# Patient Record
Sex: Female | Born: 1995 | Race: White | Hispanic: No | Smoking: Never smoker
Health system: Southern US, Community
[De-identification: ages and names within clinical notes are randomized; demographics above are authoritative.]

## PROBLEM LIST (undated history)

## (undated) DIAGNOSIS — F329 Major depressive disorder, single episode, unspecified: Secondary | ICD-10-CM

## (undated) DIAGNOSIS — J039 Acute tonsillitis, unspecified: Secondary | ICD-10-CM

## (undated) DIAGNOSIS — N921 Excessive and frequent menstruation with irregular cycle: Secondary | ICD-10-CM

## (undated) DIAGNOSIS — G4700 Insomnia, unspecified: Secondary | ICD-10-CM

## (undated) DIAGNOSIS — E210 Primary hyperparathyroidism: Secondary | ICD-10-CM

## (undated) DIAGNOSIS — R002 Palpitations: Secondary | ICD-10-CM

## (undated) DIAGNOSIS — H5462 Unqualified visual loss, left eye, normal vision right eye: Secondary | ICD-10-CM

## (undated) DIAGNOSIS — G43009 Migraine without aura, not intractable, without status migrainosus: Secondary | ICD-10-CM

## (undated) DIAGNOSIS — R300 Dysuria: Secondary | ICD-10-CM

## (undated) DIAGNOSIS — F32A Depression, unspecified: Secondary | ICD-10-CM

## (undated) DIAGNOSIS — R52 Pain, unspecified: Secondary | ICD-10-CM

## (undated) DIAGNOSIS — R631 Polydipsia: Secondary | ICD-10-CM

## (undated) HISTORY — DX: Excessive and frequent menstruation with irregular cycle: N92.1

## (undated) HISTORY — PX: OTHER SURGICAL HISTORY: SHX169

## (undated) HISTORY — DX: Major depressive disorder, single episode, unspecified: F32.9

## (undated) HISTORY — DX: Acute tonsillitis, unspecified: J03.90

## (undated) HISTORY — DX: Migraine without aura, not intractable, without status migrainosus: G43.009

## (undated) HISTORY — DX: Unqualified visual loss, left eye, normal vision right eye: H54.62

## (undated) HISTORY — DX: Polydipsia: R63.1

## (undated) HISTORY — DX: Insomnia, unspecified: G47.00

## (undated) HISTORY — DX: Depression, unspecified: F32.A

## (undated) HISTORY — DX: Palpitations: R00.2

## (undated) HISTORY — DX: Dysuria: R30.0

## (undated) HISTORY — DX: Primary hyperparathyroidism: E21.0

## (undated) HISTORY — DX: Pain, unspecified: R52

---
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
## 2015-02-25 ENCOUNTER — Other Ambulatory Visit: Payer: Self-pay | Admitting: Family Medicine

## 2015-03-22 ENCOUNTER — Ambulatory Visit: Payer: Self-pay | Admitting: Family Medicine

## 2015-03-23 ENCOUNTER — Encounter: Payer: Self-pay | Admitting: Family Medicine

## 2015-03-23 ENCOUNTER — Ambulatory Visit (INDEPENDENT_AMBULATORY_CARE_PROVIDER_SITE_OTHER): Payer: Self-pay | Admitting: Family Medicine

## 2015-03-23 VITALS — BP 120/90 | HR 96 | Temp 98.3°F | Resp 16 | Wt 274.4 lb

## 2015-03-23 DIAGNOSIS — J039 Acute tonsillitis, unspecified: Secondary | ICD-10-CM

## 2015-03-23 DIAGNOSIS — G479 Sleep disorder, unspecified: Secondary | ICD-10-CM | POA: Insufficient documentation

## 2015-03-23 DIAGNOSIS — B3731 Acute candidiasis of vulva and vagina: Secondary | ICD-10-CM | POA: Insufficient documentation

## 2015-03-23 DIAGNOSIS — H5440 Blindness, one eye, unspecified eye: Secondary | ICD-10-CM | POA: Insufficient documentation

## 2015-03-23 DIAGNOSIS — F32A Depression, unspecified: Secondary | ICD-10-CM | POA: Insufficient documentation

## 2015-03-23 DIAGNOSIS — Z87898 Personal history of other specified conditions: Secondary | ICD-10-CM | POA: Insufficient documentation

## 2015-03-23 DIAGNOSIS — R002 Palpitations: Secondary | ICD-10-CM | POA: Insufficient documentation

## 2015-03-23 DIAGNOSIS — E669 Obesity, unspecified: Secondary | ICD-10-CM | POA: Insufficient documentation

## 2015-03-23 DIAGNOSIS — G43009 Migraine without aura, not intractable, without status migrainosus: Secondary | ICD-10-CM

## 2015-03-23 DIAGNOSIS — B373 Candidiasis of vulva and vagina: Secondary | ICD-10-CM | POA: Insufficient documentation

## 2015-03-23 DIAGNOSIS — R631 Polydipsia: Secondary | ICD-10-CM | POA: Insufficient documentation

## 2015-03-23 DIAGNOSIS — J029 Acute pharyngitis, unspecified: Secondary | ICD-10-CM | POA: Insufficient documentation

## 2015-03-23 DIAGNOSIS — J0100 Acute maxillary sinusitis, unspecified: Secondary | ICD-10-CM

## 2015-03-23 DIAGNOSIS — Z8742 Personal history of other diseases of the female genital tract: Secondary | ICD-10-CM | POA: Insufficient documentation

## 2015-03-23 DIAGNOSIS — F329 Major depressive disorder, single episode, unspecified: Secondary | ICD-10-CM | POA: Insufficient documentation

## 2015-03-23 MED ORDER — TOPIRAMATE 50 MG PO TABS: 50.0000 mg | ORAL_TABLET | Freq: Every day | ORAL | Status: DC

## 2015-03-23 MED ORDER — AMOXICILLIN 875 MG PO TABS: 875.0000 mg | ORAL_TABLET | Freq: Two times a day (BID) | ORAL | Status: DC
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
## 2015-04-14 ENCOUNTER — Other Ambulatory Visit: Payer: Self-pay | Admitting: Family Medicine

## 2015-04-23 ENCOUNTER — Telehealth: Payer: Self-pay | Admitting: Family Medicine

## 2015-04-23 DIAGNOSIS — J312 Chronic pharyngitis: Secondary | ICD-10-CM

## 2015-04-23 NOTE — Telephone Encounter (Signed)
See patient message

## 2015-04-23 NOTE — Telephone Encounter (Signed)
Pt mom, Larita Fife called states pt was in 3 weeks ago for sinus congestion and sore throat.  Pt mom states pt has sinus congestion, sore throat and cough again.  Pt mom states the Rx she had caused pt to have an upset stomach.  Pt mom is requesting a referral to [facility].  JY#[phone]/QM

## 2015-04-23 NOTE — Telephone Encounter (Signed)
Referral request

## 2015-04-23 NOTE — Telephone Encounter (Signed)
Persistent sore throat and sinus congestion over the past 3 weeks despite treatment with antibiotic and Mucinex. Mother requesting referral to [facility].

## 2015-04-26 ENCOUNTER — Other Ambulatory Visit: Payer: Self-pay | Admitting: Family Medicine

## 2015-04-26 NOTE — Telephone Encounter (Signed)
Dennis patient 

## 2015-05-07 ENCOUNTER — Other Ambulatory Visit: Payer: Self-pay | Admitting: Family Medicine

## 2015-07-24 ENCOUNTER — Other Ambulatory Visit: Payer: Self-pay | Admitting: Family Medicine

## 2015-07-26 ENCOUNTER — Other Ambulatory Visit: Payer: Self-pay | Admitting: Family Medicine

## 2015-08-22 ENCOUNTER — Other Ambulatory Visit: Payer: Self-pay | Admitting: Family Medicine

## 2015-09-17 ENCOUNTER — Other Ambulatory Visit: Payer: Self-pay | Admitting: Family Medicine

## 2015-10-07 ENCOUNTER — Encounter: Payer: Self-pay | Admitting: Family Medicine

## 2015-10-07 ENCOUNTER — Ambulatory Visit (INDEPENDENT_AMBULATORY_CARE_PROVIDER_SITE_OTHER): Payer: Medicaid Other | Admitting: Family Medicine

## 2015-10-07 VITALS — BP 118/82 | HR 80 | Temp 98.2°F | Resp 16 | Wt 265.4 lb

## 2015-10-07 DIAGNOSIS — R300 Dysuria: Secondary | ICD-10-CM

## 2015-10-07 LAB — POCT URINALYSIS DIPSTICK
BILIRUBIN UA: NEGATIVE
GLUCOSE UA: NEGATIVE
Ketones, UA: NEGATIVE
Nitrite, UA: NEGATIVE
Protein, UA: NEGATIVE
SPEC GRAV UA: 1.015
Urobilinogen, UA: 0.2
pH, UA: 6.5

## 2015-10-07 MED ORDER — SULFAMETHOXAZOLE-TRIMETHOPRIM 800-160 MG PO TABS: 1.0000 | ORAL_TABLET | Freq: Two times a day (BID) | ORAL | Status: DC
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
## 2015-10-07 NOTE — Patient Instructions (Signed)

## 2015-10-18 ENCOUNTER — Other Ambulatory Visit: Payer: Self-pay | Admitting: Family Medicine

## 2015-12-15 ENCOUNTER — Other Ambulatory Visit: Payer: Self-pay | Admitting: Family Medicine

## 2015-12-18 ENCOUNTER — Other Ambulatory Visit: Payer: Self-pay | Admitting: Family Medicine

## 2015-12-19 ENCOUNTER — Other Ambulatory Visit: Payer: Self-pay | Admitting: Family Medicine

## 2015-12-20 ENCOUNTER — Other Ambulatory Visit: Payer: Self-pay

## 2015-12-20 NOTE — Telephone Encounter (Signed)
[facility] Top[facility] 50 mg.

## 2015-12-27 ENCOUNTER — Telehealth: Payer: Self-pay | Admitting: Family Medicine

## 2016-01-17 ENCOUNTER — Encounter: Payer: Self-pay | Admitting: Family Medicine

## 2016-01-17 ENCOUNTER — Ambulatory Visit
Admission: RE | Admit: 2016-01-17 | Discharge: 2016-01-17 | Disposition: A | Discharge status: Home / Self Care | Payer: Medicaid Other | Source: Ambulatory Visit | Attending: Family Medicine | Admitting: Family Medicine

## 2016-01-17 ENCOUNTER — Telehealth: Payer: Self-pay | Admitting: Family Medicine

## 2016-01-17 ENCOUNTER — Ambulatory Visit (INDEPENDENT_AMBULATORY_CARE_PROVIDER_SITE_OTHER): Payer: Medicaid Other | Admitting: Family Medicine

## 2016-01-17 ENCOUNTER — Other Ambulatory Visit: Payer: Self-pay | Admitting: Family Medicine

## 2016-01-17 VITALS — BP 118/90 | HR 92 | Temp 97.7°F | Resp 16 | Wt 260.2 lb

## 2016-01-17 DIAGNOSIS — M25562 Pain in left knee: Secondary | ICD-10-CM | POA: Insufficient documentation

## 2016-01-17 DIAGNOSIS — M25462 Effusion, left knee: Secondary | ICD-10-CM | POA: Diagnosis not present

## 2016-01-17 MED ORDER — HYDROCODONE-ACETAMINOPHEN 10-325 MG PO TABS: 1.0000 | ORAL_TABLET | Freq: Three times a day (TID) | ORAL | Status: DC | PRN

## 2016-01-17 MED ORDER — DICLOFENAC POTASSIUM 50 MG PO TABS: 50.0000 mg | ORAL_TABLET | Freq: Three times a day (TID) | ORAL | Status: DC

## 2016-01-17 NOTE — Telephone Encounter (Signed)
May switch to Hydrocodone/APAP (Norco) 5/325 mg 1 TID prn pain #21. Encourage to use the Ibuprofen 600 mg QID first.

## 2016-01-17 NOTE — Telephone Encounter (Signed)
Diclofenac requires a PA so patient's mother would like medication to be switched. Patient has Medicaid. Please advise.

## 2016-01-17 NOTE — Patient Instructions (Signed)
Patellar Dislocation and Subluxation With Phase I Rehab Injuries to the knee often include knee cap (patellar) dislocation or subluxation. The patella is a V-shaped bone that sits in a groove in the thigh bone (trochlea). A patellar dislocation occurs when the knee cap is displaced from the trochlea, and the joint surfaces are no longer touching. A subluxation is a similar injury, where the knee cap becomes displaced, but the joint surfaces are still touching. Patellar dislocations and subluxations are most common in adolescents and younger adults. SYMPTOMS   Severe pain in the front of the knee when attempting to move the knee.  A feeling of the knee giving way.  Tenderness, swelling, and bruising (contusion) of the knee.  Numbness or paralysis below the dislocation, from pinching, cutting, or pressure on the blood vessels or nerves (uncommon).  Visible deformity, especially if the dislocation of the knee cap occurs towards the outside of the knee.  Lump on the inner knee, which is the end of the inner part of the thigh bone (femur). CAUSES  Patellar dislocations are caused by a force placed on the knee cap, that is strong enough to displace the bone from its proper alignment. Common causes of injury include:  Direct hit (trauma) to the knee.  Twisting or pivoting injury to the lower limb, when the foot is planted on the ground.  Powerful muscle contraction.  Birth defect (congenital abnormality), such as shallow or malformed joint surfaces. RISK INCREASES WITH:  Contact sports (football, rugby, soccer), sports that require jumping and landing (basketball, volleyball), or sports in which cleats are worn on shoes.  People with wide pelvis, knocked knees, shallow or malformed joint surfaces.  Previous knee injury.  Poor strength and flexibility. PREVENTION  Warm up and stretch properly before activity.  Maintain physical fitness:  Strength, flexibility, and  endurance.  Cardiovascular fitness.  For jumping or contact sports, protect the knee cap with supportive devices (elastic bandages, tape, braces, knee sleeves with a hole for the patella and a built-up outer side, or straps to pull the patella inward, or knee pads).  Use cleats of proper length. PROGNOSIS  If treated properly, patellar dislocations and subluxations usually require at least 6 weeks to heal.  RELATED COMPLICATIONS   Associated fracture or joint cartilage injury.  Damage to nearby nerves or major blood vessels (rare).  Longer healing time or recurring dislocation, if activity is resumed too soon.  Excessive bleeding within the knee, due to dislocation.  Knee cap pain and giving way, often due to inadequate or incomplete rehabilitation.  Unstable or arthritic joint, following repeated injury or delayed treatment. TREATMENT Patellar dislocations and subluxations require immediate realigning of the bones (reduction). Realigning is often completed by hand. However, surgery is sometimes needed. After realignment, treatment first involves the use of ice and medicine, to reduce pain and inflammation. Elevating the injured knee above the level of the heart will also help reduce inflammation. Restraining the knee is often needed, to allow for healing, for up to 6 weeks. After restraint, it is important to perform strengthening and stretching exercises to help regain strength and a full range of motion. These exercises may be completed at home or with a therapist. MEDICATION   If pain medicine is needed, nonsteroidal anti-inflammatory medicines (aspirin and ibuprofen), or other minor pain relievers (acetaminophen), are often advised.  Do not take pain medicine for 7 days before surgery.  Prescription pain relievers may be given, if your caregiver thinks they are needed. Use only   as directed and only as much as you need. HEAT AND COLD  Cold treatment (icing) should be applied for  10 to 15 minutes every 2 to 3 hours for inflammation and pain, and immediately after activity that aggravates your symptoms. Use ice packs or an ice massage.  Heat treatment may be used before performing stretching and strengthening activities prescribed by your caregiver, physical therapist, or athletic trainer. Use a heat pack or a warm water soak. SEEK MEDICAL CARE IF:  Pain, tenderness, or swelling gets worse, despite treatment.  You experience pain, numbness, or coldness in the foot.  Blue, gray, or dark color appears in the toenails.  Any of the following signs of infection occur after surgery: fever, increased pain, swelling, redness, drainage of fluids, or bleeding in the affected area.  New, unexplained symptoms develop. (Drugs used in treatment may produce side effects.) EXERCISES PHASE I EXERCISES RANGE OF MOTION (ROM) AND STRETCHING EXERCISES - Patellar Dislocation and Subluxation Phase I  FIRST TIME DISLOCATIONS OR SUBLUXATIONS: If you have dislocated or subluxated your knee cap for the first time, your caregiver may ask you to wear a knee brace for up to 6 weeks after your injury. This brace will keep your knee completely straight so that the healing tissues are not disrupted by your knee movement. Your caregiver may allow some motion at your knee by using a hinged brace or by allowing you to take your brace off for a limited amount of time to gently bend and straighten your knee. If this is the case, closely follow your caregiver's directions, in order to allow the best recovery.   CHRONIC OR REPEATED DISLOCATIONS OR SUBLUXATIONS: If you have chronic or repeated dislocations or subluxations, your caregiver will likely not ask you to use a brace. He or she will likely have you begin gentle range of motion activities, within the range that is comfortable for you.  Once you are allowed to start moving your knee, these are some of the initial exercises that may be included in your  rehabilitation program. Continue to perform them until you see your caregiver again or until your symptoms go away. While completing these exercises, remember:   Restoring tissue flexibility helps normal motion to return to the joints. This allows healthier, less painful movement and activity.  An effective stretch should be held for at least 30 seconds.  A stretch should never be painful. You should only feel a gentle lengthening or release in the stretched tissue. RANGE OF MOTION - Knee Flexion, Active  Lie on your back with both knees straight. (If this causes back discomfort, bend your opposite knee, placing your foot flat on the floor.)  Slowly slide your heel back toward your buttocks until you feel a gentle stretch in the front of your knee or thigh.  Hold for __________ seconds. Slowly slide your heel back to the starting position. Repeat __________ times. Complete this exercise __________ times per day.  RANGE OF MOTION - Knee Flexion and Extension, Active-Assisted  Sit on the edge of a table or chair with your thighs firmly supported. It may be helpful to place a folded towel under the end of your right / left thigh.  Flexion (bending): Place the ankle of your healthy leg on top of the other ankle. Use your healthy leg to gently bend your right / left knee until you feel a mild tension across the top of your knee.  Hold for __________ seconds.  Extension (straightening): Switch your ankles   so your right / left leg is on top. Use your healthy leg to straighten your right / left knee until you feel a mild tension on the backside of your knee.  Hold for __________ seconds. Repeat __________ times. Complete this exercise __________ times per day. STRETCH - Knee Flexion, Supine  Lie on the floor with your right / left heel and foot lightly touching the wall. (Place both feet on the wall if you do not use a door frame.)  Without using any effort, allow gravity to slide your foot  down the wall slowly until you feel a gentle stretch in the front of your right / left knee.  Hold this stretch for __________ seconds. Then return the leg to the starting position, using your healthy leg for help, if needed. Repeat __________ times. Complete this stretch __________ times per day.  STRENGTHENING EXERCISES - Patellar Dislocation and Subluxation Phase I These exercises may help you when beginning to rehabilitate your injury. They may resolve your symptoms with or without further involvement from your physician, physical therapist or athletic trainer. While completing these exercises, remember:   Muscles can gain both the endurance and the strength needed for everyday activities through controlled exercises.  Complete these exercises as instructed by your physician, physical therapist or athletic trainer. Increase the resistance and repetitions only as guided by your caregiver. STRENGTH - Quadriceps, Isometrics  Lie on your back with your right / left leg extended and your opposite knee bent.  Gradually tense the muscles in the front of your right / left thigh. You should see either your knee cap slide up toward your hip or increased dimpling just above the knee. This motion will push the back of the knee down toward the floor, mat, or bed on which you are lying.  Hold the muscle as tight as you can, without increasing your pain, for __________ seconds.  Relax the muscles slowly and completely in between each repetition. Repeat __________ times. Complete this exercise __________ times per day.  STRENGTH - Quadriceps, Straight Leg Raises Quality counts! Watch for signs that the quadriceps muscle is working, to be sure you are strengthening the correct muscles and not "cheating" by substituting with healthier muscles.  Lay on your back with your right / left leg extended and your opposite knee bent.  Tense the muscles in the front of your right / left thigh. You should see either  your knee cap slide up or increased dimpling just above the knee. Your thigh may even shake a bit.  Tighten these muscles even more and raise your leg 4 to 6 inches off the floor. Hold for __________ seconds.  Keeping these muscles tense, lower your leg.  Relax the muscles slowly and completely between each repetition. Repeat __________ times. Complete this exercise __________ times per day.  STRENGTH - Hip Abductors, Straight Leg Raises Be aware of your form throughout the entire exercise, so that you exercise the correct muscles. Poor form means that you are not strengthening the correct muscles.  Lie on your side so that your head, shoulders, knee and hip line up. You may bend your lower knee to help maintain your balance. Your right / left leg should be on top.  Roll your hips slightly forward, so that your hips are stacked directly over each other and your right / left knee is facing forward.  Lift your top leg up 4-6 inches, leading with your heel. Be sure that your foot does not drift forward   or that your knee does not roll toward the ceiling.  Hold this position for __________ seconds. You should feel the muscles in your outer hip lifting. (You may not notice this until your leg begins to tire.)  Slowly lower your leg to the starting position. Allow the muscles to fully relax before beginning the next repetition. Repeat __________ times. Complete this exercise __________ times per day.  STRENGTH - Hip Extensors, Straight Leg Raises  Lie on your stomach on a firm surface.  Tense the muscles in your buttocks to lift your right / left leg about 4 inches. If you cannot lift your leg this high without arching your back, place a pillow under your hips.  Keep your knee straight. Hold __________ seconds.  Slowly lower your leg to the starting position and allow it to relax completely before completing the next repetition. Repeat __________ times. Complete this exercise __________ times  per day.  STRENGTH - Hip Adductors, Straight Leg Raises  Lie on your side so that your head, shoulders, knee and hip line up. You may place your upper foot in front, to help maintain your balance. Your right / left leg should be on the bottom.  Roll your hips slightly forward, so that your hips are stacked directly over each other and your right / left knee is facing forward.  Tense the muscles in your inner thigh and lift your bottom leg 4-6 inches. Hold this position for __________ seconds.  Slowly lower your leg to the starting position. Allow the muscles to fully relax before beginning the next repetition. Repeat __________ times. Complete this exercise __________ times per day.    This information is not intended to replace advice given to you by your health care provider. Make sure you discuss any questions you have with your health care provider.   Document Released: 08/21/2005 Document Revised: 05/12/2015 Document Reviewed: 12/03/2008 Elsevier Interactive Patient Education 2016 [employer]  
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
## 2016-01-17 NOTE — Telephone Encounter (Signed)
[facility] called saying they needed a PR on her medication diclofenac (CATAFLAM) 50 MG tablet  Mom asked if you would just prescribe the hydrocodone instead  Mom's call back is [phone]  Please let mom know something today.  Thanks, Barth Kirkseri

## 2016-01-17 NOTE — Telephone Encounter (Signed)
Patient's mother Larita FifeLynn advised as directed below.

## 2016-01-19 ENCOUNTER — Other Ambulatory Visit: Payer: Self-pay | Admitting: Family Medicine

## 2016-01-27 ENCOUNTER — Ambulatory Visit (INDEPENDENT_AMBULATORY_CARE_PROVIDER_SITE_OTHER): Payer: Medicaid Other | Admitting: Family Medicine

## 2016-01-27 ENCOUNTER — Encounter: Payer: Self-pay | Admitting: Family Medicine

## 2016-01-27 VITALS — BP 122/78 | HR 84 | Temp 98.4°F | Resp 16 | Wt 260.0 lb

## 2016-01-27 DIAGNOSIS — M25562 Pain in left knee: Secondary | ICD-10-CM | POA: Diagnosis not present

## 2016-01-27 DIAGNOSIS — Z8739 Personal history of other diseases of the musculoskeletal system and connective tissue: Secondary | ICD-10-CM | POA: Diagnosis not present

## 2016-01-27 DIAGNOSIS — S83002D Unspecified subluxation of left patella, subsequent encounter: Secondary | ICD-10-CM | POA: Diagnosis not present

## 2016-01-27 DIAGNOSIS — Z87828 Personal history of other (healed) physical injury and trauma: Secondary | ICD-10-CM
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
## 2016-05-10 ENCOUNTER — Other Ambulatory Visit: Payer: Self-pay

## 2016-05-10 NOTE — Telephone Encounter (Signed)
Request received from [facility] requesting Amitriptyline and Levora. Patient switched pharmacies and RX expired per [facility].

## 2016-05-12 MED ORDER — LEVONORGESTREL-ETHINYL ESTRAD 0.15-30 MG-MCG PO TABS: 1.0000 | ORAL_TABLET | Freq: Every day | ORAL | 11 refills | Status: DC

## 2016-05-12 MED ORDER — AMITRIPTYLINE HCL 50 MG PO TABS: 50.0000 mg | ORAL_TABLET | Freq: Every day | ORAL | 6 refills | Status: DC

## 2016-06-16 ENCOUNTER — Other Ambulatory Visit: Payer: Self-pay | Admitting: Family Medicine

## 2016-07-19 ENCOUNTER — Other Ambulatory Visit: Payer: Self-pay | Admitting: Family Medicine

## 2016-08-24 ENCOUNTER — Other Ambulatory Visit: Payer: Self-pay | Admitting: Family Medicine

## 2016-09-22 ENCOUNTER — Other Ambulatory Visit: Payer: Self-pay

## 2016-09-22 NOTE — Telephone Encounter (Signed)
Refill request received from [facility] requesting amitriptyline (ELAVIL) 50 MG tablet

## 2016-09-22 NOTE — Telephone Encounter (Signed)
[facility] advised via fax that patient needs OV before further refills.

## 2016-09-22 NOTE — Telephone Encounter (Signed)
Refill on 08-24-16 should have a refill remaining. Remind patient to schedule follow up appointment before further refills.

## 2016-09-25 NOTE — Telephone Encounter (Signed)
Patient picked up RX on 09/19/2016 left patient a voicemail advising her to schedule a FU OV before next RF is due.

## 2016-10-25 ENCOUNTER — Other Ambulatory Visit: Payer: Self-pay | Admitting: Family Medicine

## 2016-11-07 ENCOUNTER — Ambulatory Visit (INDEPENDENT_AMBULATORY_CARE_PROVIDER_SITE_OTHER): Payer: Medicaid Other | Admitting: Family Medicine

## 2016-11-07 ENCOUNTER — Encounter: Payer: Self-pay | Admitting: Family Medicine

## 2016-11-07 VITALS — BP 104/68 | HR 79 | Temp 98.4°F | Resp 16 | Wt 231.6 lb

## 2016-11-07 DIAGNOSIS — F329 Major depressive disorder, single episode, unspecified: Secondary | ICD-10-CM | POA: Diagnosis not present

## 2016-11-07 DIAGNOSIS — G43809 Other migraine, not intractable, without status migrainosus: Secondary | ICD-10-CM

## 2016-11-07 DIAGNOSIS — F32A Depression, unspecified: Secondary | ICD-10-CM

## 2016-11-07 MED ORDER — AMITRIPTYLINE HCL 50 MG PO TABS: 50.0000 mg | ORAL_TABLET | Freq: Every day | ORAL | 6 refills | Status: DC
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
## 2016-11-08 LAB — COMPREHENSIVE METABOLIC PANEL
ALBUMIN: 4.3 g/dL (ref 3.5–5.5)
ALT: 12 IU/L (ref 0–32)
AST: 13 IU/L (ref 0–40)
Albumin/Globulin Ratio: 1.8 (ref 1.2–2.2)
Alkaline Phosphatase: 68 IU/L (ref 39–117)
BUN / CREAT RATIO: 16 (ref 9–23)
BUN: 14 mg/dL (ref 6–20)
Bilirubin Total: 0.8 mg/dL (ref 0.0–1.2)
CALCIUM: 9.5 mg/dL (ref 8.7–10.2)
CO2: 20 mmol/L (ref 18–29)
CREATININE: 0.86 mg/dL (ref 0.57–1.00)
Chloride: 105 mmol/L (ref 96–106)
GFR, EST AFRICAN AMERICAN: 112 mL/min/{1.73_m2} (ref >59)
GFR, EST NON AFRICAN AMERICAN: 98 mL/min/{1.73_m2} (ref >59)
GLOBULIN, TOTAL: 2.4 g/dL (ref 1.5–4.5)
Glucose: 86 mg/dL (ref 65–99)
Potassium: 4.2 mmol/L (ref 3.5–5.2)
SODIUM: 142 mmol/L (ref 134–144)
Total Protein: 6.7 g/dL (ref 6.0–8.5)

## 2016-11-08 LAB — CBC WITH DIFFERENTIAL/PLATELET
BASOS: 0 %
Basophils Absolute: 0 10*3/uL (ref 0.0–0.2)
EOS (ABSOLUTE): 0.2 10*3/uL (ref 0.0–0.4)
Eos: 2 %
HEMATOCRIT: 41.6 % (ref 34.0–46.6)
HEMOGLOBIN: 14 g/dL (ref 11.1–15.9)
IMMATURE GRANULOCYTES: 0 %
Immature Grans (Abs): 0 10*3/uL (ref 0.0–0.1)
LYMPHS ABS: 2.3 10*3/uL (ref 0.7–3.1)
Lymphs: 28 %
MCH: 33 pg (ref 26.6–33.0)
MCHC: 33.7 g/dL (ref 31.5–35.7)
MCV: 98 fL — ABNORMAL HIGH (ref 79–97)
MONOCYTES: 8 %
Monocytes Absolute: 0.6 10*3/uL (ref 0.1–0.9)
NEUTROS PCT: 62 %
Neutrophils Absolute: 4.9 10*3/uL (ref 1.4–7.0)
Platelets: 334 10*3/uL (ref 150–379)
RBC: 4.24 x10E6/uL (ref 3.77–5.28)
RDW: 12.1 % — ABNORMAL LOW (ref 12.3–15.4)
WBC: 8 10*3/uL (ref 3.4–10.8)

## 2016-11-08 LAB — TSH: TSH: 2.27 u[IU]/mL (ref 0.450–4.500)

## 2016-11-10 ENCOUNTER — Telehealth: Payer: Self-pay | Admitting: Emergency Medicine

## 2016-11-10 NOTE — Telephone Encounter (Signed)
Pt advised-aa 

## 2016-11-10 NOTE — Telephone Encounter (Signed)
-----   Message from Tamsen Roersennis E Chrismon, [place] sent at 11/09/2016  5:16 PM EST ----- Normal blood tests except slight increase of  MCV. This increase in the size of RBC's may mean there is a need for extra B vitamins such as Stresstabs or B-Complex daily.

## 2016-11-14 ENCOUNTER — Telehealth: Payer: Self-pay | Admitting: Family Medicine

## 2016-11-14 ENCOUNTER — Other Ambulatory Visit: Payer: Self-pay | Admitting: Family Medicine

## 2016-11-14 DIAGNOSIS — Z975 Presence of (intrauterine) contraceptive device: Secondary | ICD-10-CM

## 2016-11-14 NOTE — Telephone Encounter (Signed)
Please review. Bronco Mcgrory Drozdowski, CMA  

## 2016-11-14 NOTE — Telephone Encounter (Signed)
Pt's mom called on pt's behalf to request a referral to GYN b/c she would like to discuss getting an IUD. Pt had OV with Maurine Ministerennis on 11/07/16 and understands if she needs to come back in for OV to process the referral but wanted to see if the referral could be done without another OV. [facility]. CB# [phone]. Please advise. Thanks TNP

## 2016-11-14 NOTE — Telephone Encounter (Signed)
Placed order in chart for referral.

## 2016-11-14 NOTE — Progress Notes (Signed)
Requests referral to [facility] for IUD placement if appropriate.

## 2016-11-15 NOTE — Telephone Encounter (Signed)
Advised. Elysha Daw Drozdowski, CMA  

## 2016-12-26 ENCOUNTER — Telehealth: Payer: Self-pay | Admitting: Obstetrics & Gynecology

## 2016-12-26 NOTE — Telephone Encounter (Signed)
Called pt and left voicemail. At this time we aren't accepting new Medicaid Pt . Pt has never been seen before. Called [facility]

## 2017-01-09 ENCOUNTER — Ambulatory Visit (INDEPENDENT_AMBULATORY_CARE_PROVIDER_SITE_OTHER): Payer: Medicaid Other | Admitting: Certified Nurse Midwife

## 2017-01-09 ENCOUNTER — Encounter: Payer: Self-pay | Admitting: Certified Nurse Midwife

## 2017-01-09 VITALS — BP 110/73 | HR 77 | Ht 66.0 in | Wt 221.0 lb

## 2017-01-09 DIAGNOSIS — Z3009 Encounter for other general counseling and advice on contraception: Secondary | ICD-10-CM

## 2017-01-09 NOTE — Patient Instructions (Signed)

## 2017-01-09 NOTE — Progress Notes (Signed)
Subjective:    Emily Simon is a 21 y.o. female who presents for contraception counseling. The patient has no complaints today. The patient is currently sexually active. Pertinent past medical history: migraines.  Menstrual History: OB History    Gravida Para Term Preterm AB Living   0 0 0 0 0 0   SAB TAB Ectopic Multiple Live Births   0 0 0 0 0       Patient's last menstrual period was 01/09/2017 (exact date).    The following portions of the patient's history were reviewed and updated as appropriate: allergies, current medications, past family history, past medical history, past social history, past surgical history and problem list.  Review of Systems Pertinent items noted in HPI and remainder of comprehensive ROS otherwise negative.   Objective:    No exam performed today..   Assessment:    21 y.o., starting IUD, no contraindications.   Plan:  Reviewed multiple contraceptive options. She is requesting IUD for management of heavy menstrual bleeding. Discussed benefits and risks of IUD placement, Reviewed current medications used for Migraines may decrease effectiveness of birth control action. She is aware and agrees to condom use.  Reviewed common side effects. Mirena pamphlet given to pt to review. Procedure explained. Encouraged use of 800 mg Ibuprofen prior to placement. She will make an appointment for placement.   I attest that more than 50% of the visit was spent counseling the pt. I  reviewed birth control options, counseling on risks , benefits , and procedure of mirena placement. I All questions answered. SHe will schedule for placement     Doreene BurkeAnnie Ronesha Heenan, CNM

## 2017-01-16 ENCOUNTER — Ambulatory Visit: Payer: Medicaid Other | Admitting: Certified Nurse Midwife

## 2017-01-17 ENCOUNTER — Ambulatory Visit (INDEPENDENT_AMBULATORY_CARE_PROVIDER_SITE_OTHER): Payer: Medicaid Other | Admitting: Certified Nurse Midwife

## 2017-01-17 ENCOUNTER — Encounter: Payer: Self-pay | Admitting: Certified Nurse Midwife

## 2017-01-17 VITALS — BP 110/77 | HR 79 | Wt 224.1 lb

## 2017-01-17 DIAGNOSIS — Z30014 Encounter for initial prescription of intrauterine contraceptive device: Secondary | ICD-10-CM

## 2017-01-17 LAB — POCT URINE PREGNANCY: PREG TEST UR: NEGATIVE

## 2017-01-17 NOTE — Progress Notes (Signed)
    GYNECOLOGY OFFICE PROCEDURE NOTE  Ulyses JarredKayleigh Frieden is a 21 y.o. G0P0000 here for Mirena IUD insertion. No GYN concerns.   IUD Insertion Procedure Note Patient identified, informed consent performed, consent signed.   Discussed risks of irregular bleeding, cramping, infection, malpositioning or misplacement of the IUD outside the uterus which may require further procedure such as laparoscopy. Time out was performed.  Urine pregnancy test negative.  Speculum placed in the vagina.  Cervix visualized.  Cleaned with Betadine x 2.  Grasped anteriorly with a single tooth tenaculum.  Uterus sounded to 8 cm.  Mirena IUD placed per manufacturer's recommendations.  Strings trimmed to 3 cm. Tenaculum was removed, good hemostasis noted.  Patient tolerated procedure well.   Patient was given post-procedure instructions.  She was advised to have backup contraception for one- two week.  Patient was also asked to check IUD strings periodically and follow up in 4 weeks for IUD check.   Doreene BurkeAnnie Juliene Kirsh, CNM

## 2017-01-17 NOTE — Patient Instructions (Signed)

## 2017-02-14 ENCOUNTER — Encounter: Payer: Self-pay | Admitting: Certified Nurse Midwife

## 2017-02-14 ENCOUNTER — Ambulatory Visit (INDEPENDENT_AMBULATORY_CARE_PROVIDER_SITE_OTHER): Payer: Medicaid Other | Admitting: Certified Nurse Midwife

## 2017-02-14 VITALS — BP 106/71 | HR 77 | Wt 219.7 lb

## 2017-02-14 DIAGNOSIS — Z30431 Encounter for routine checking of intrauterine contraceptive device: Secondary | ICD-10-CM

## 2017-02-14 NOTE — Progress Notes (Signed)
GYNECOLOGY OFFICE PROGRESS NOTE  History:  21 y.o. G0P0000 here today for today for IUD string check; Mirena IUD was placed  01/17/17. Complains of occasional mild cramping and spotting, no concerning side effects.  The following portions of the patient's history were reviewed and updated as appropriate: allergies, current medications, past family history, past medical history, past social history, past surgical history and problem list. Has not had a pap smear.   Review of Systems:  Pertinent items are noted in HPI.   Objective:  Physical Exam Blood pressure 106/71, pulse 77, weight 219 lb 11.2 oz (99.7 kg), last menstrual period 02/13/2017. CONSTITUTIONAL: Well-developed, well-nourished female in no acute distress.  HENT:  Normocephalic, atraumatic. External right and left ear normal. Oropharynx is clear and moist EYES: Conjunctivae and EOM are normal.  No scleral icterus.  NECK: Normal range of motion CARDIOVASCULAR: Normal heart rate noted RESPIRATORY: normal effort, no problems with respiration noted ABDOMEN: Soft, no distention noted.   PELVIC: Normal appearing external genitalia; normal appearing vaginal mucosa and cervix.  IUD strings visualized, about 3 cm in length outside cervix.   Assessment & Plan:  Normal IUD check. Patient to keep IUD in place for five years; can come in for removal if she desires pregnancy within the next five years. Routine preventative health maintenance measures emphasized. Annual exam asap for pap smear.    Doreene BurkeAnnie Ayanna Gheen, CNM

## 2017-02-14 NOTE — Patient Instructions (Signed)

## 2017-02-19 ENCOUNTER — Encounter: Payer: Self-pay | Admitting: Certified Nurse Midwife

## 2017-02-19 ENCOUNTER — Ambulatory Visit (INDEPENDENT_AMBULATORY_CARE_PROVIDER_SITE_OTHER): Payer: Medicaid Other | Admitting: Certified Nurse Midwife

## 2017-02-19 VITALS — BP 103/79 | HR 82 | Ht 66.0 in | Wt 219.2 lb

## 2017-02-19 DIAGNOSIS — Z01419 Encounter for gynecological examination (general) (routine) without abnormal findings: Secondary | ICD-10-CM

## 2017-02-19 DIAGNOSIS — Z0000 Encounter for general adult medical examination without abnormal findings: Secondary | ICD-10-CM
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
## 2017-02-19 NOTE — Patient Instructions (Addendum)
Preventive Care 18-39 Years, Female Preventive care refers to lifestyle choices and visits with your health care provider that can promote health and wellness. What does preventive care include?  A yearly physical exam. This is also called an annual well check.  Dental exams once or twice a year.  Routine eye exams. Ask your health care provider how often you should have your eyes checked.  Personal lifestyle choices, including: ? Daily care of your teeth and gums. ? Regular physical activity. ? Eating a healthy diet. ? Avoiding tobacco and drug use. ? Limiting alcohol use. ? Practicing safe sex. ? Taking vitamin and mineral supplements as recommended by your health care provider. What happens during an annual well check? The services and screenings done by your health care provider during your annual well check will depend on your age, overall health, lifestyle risk factors, and family history of disease. Counseling Your health care provider may ask you questions about your:  Alcohol use.  Tobacco use.  Drug use.  Emotional well-being.  Home and relationship well-being.  Sexual activity.  Eating habits.  Work and work Statistician.  Method of birth control.  Menstrual cycle.  Pregnancy history.  Screening You may have the following tests or measurements:  Height, weight, and BMI.  Diabetes screening. This is done by checking your blood sugar (glucose) after you have not eaten for a while (fasting).  Blood pressure.  Lipid and cholesterol levels. These may be checked every 5 years starting at age 66.  Skin check.  Hepatitis C blood test.  Hepatitis B blood test.  Sexually transmitted disease (STD) testing.  BRCA-related cancer screening. This may be done if you have a family history of breast, ovarian, tubal, or peritoneal cancers.  Pelvic exam and Pap test. This may be done every 3 years starting at age 40. Starting at age 59, this may be done every 5  years if you have a Pap test in combination with an HPV test.  Discuss your test results, treatment options, and if necessary, the need for more tests with your health care provider. Vaccines Your health care provider may recommend certain vaccines, such as:  Influenza vaccine. This is recommended every year.  Tetanus, diphtheria, and acellular pertussis (Tdap, Td) vaccine. You may need a Td booster every 10 years.  Varicella vaccine. You may need this if you have not been vaccinated.  HPV vaccine. If you are 69 or younger, you may need three doses over 6 months.  Measles, mumps, and rubella (MMR) vaccine. You may need at least one dose of MMR. You may also need a second dose.  Pneumococcal 13-valent conjugate (PCV13) vaccine. You may need this if you have certain conditions and were not previously vaccinated.  Pneumococcal polysaccharide (PPSV23) vaccine. You may need one or two doses if you smoke cigarettes or if you have certain conditions.  Meningococcal vaccine. One dose is recommended if you are age 27-21 years and a first-year college student living in a residence hall, or if you have one of several medical conditions. You may also need additional booster doses.  Hepatitis A vaccine. You may need this if you have certain conditions or if you travel or work in places where you may be exposed to hepatitis A.  Hepatitis B vaccine. You may need this if you have certain conditions or if you travel or work in places where you may be exposed to hepatitis B.  Haemophilus influenzae type b (Hib) vaccine. You may need this if  you have certain risk factors.  Talk to your health care provider about which screenings and vaccines you need and how often you need them. This information is not intended to replace advice given to you by your health care provider. Make sure you discuss any questions you have with your health care provider. Document Released: 10/17/2001 Document Revised: 05/10/2016  Document Reviewed: 06/22/2015 Elsevier Interactive Patient Education  2017 [employer].  Exercising to [employer] Exercising can help you to lose weight. In order to lose weight through exercise, you need to do vigorous-intensity exercise. You can tell that you are exercising with vigorous intensity if you are breathing very hard and fast and cannot hold a conversation while exercising. Moderate-intensity exercise helps to maintain your current weight. You can tell that you are exercising at a moderate level if you have a higher heart rate and faster breathing, but you are still able to hold a conversation. How often should I exercise? Choose an activity that you enjoy and set realistic goals. Your health care provider can help you to make an activity plan that works for you. Exercise regularly as directed by your health care provider. This may include:  Doing resistance training twice each week, such as: ? Push-ups. ? Sit-ups. ? Lifting weights. ? Using resistance bands.  Doing a given intensity of exercise for a given amount of time. Choose from these options: ? 150 minutes of moderate-intensity exercise every week. ? 75 minutes of vigorous-intensity exercise every week. ? A mix of moderate-intensity and vigorous-intensity exercise every week.  Children, pregnant women, people who are out of shape, people who are overweight, and older adults may need to consult a health care provider for individual recommendations. If you have any sort of medical condition, be sure to consult your health care provider before starting a new exercise program. What are some activities that can help me to lose weight?  Walking at a rate of at least 4.5 miles an hour.  Jogging or running at a rate of 5 miles per hour.  Biking at a rate of at least 10 miles per hour.  Lap swimming.  Roller-skating or in-line skating.  Cross-country skiing.  Vigorous competitive sports, such as football, basketball,  and soccer.  Jumping rope.  Aerobic dancing. How can I be more active in my day-to-day activities?  Use the stairs instead of the elevator.  Take a walk during your lunch break.  If you drive, park your car farther away from work or school.  If you take public transportation, get off one stop early and walk the rest of the way.  Make all of your phone calls while standing up and walking around.  Get up, stretch, and walk around every 30 minutes throughout the day. What guidelines should I follow while exercising?  Do not exercise so much that you hurt yourself, feel dizzy, or get very short of breath.  Consult your health care provider prior to starting a new exercise program.  Wear comfortable clothes and shoes with good support.  Drink plenty of water while you exercise to prevent dehydration or heat stroke. Body water is lost during exercise and must be replaced.  Work out until you breathe faster and your heart beats faster. This information is not intended to replace advice given to you by your health care provider. Make sure you discuss any questions you have with your health care provider. Document Released: 09/23/2010 Document Revised: 01/27/2016 Document Reviewed: 01/22/2014 Elsevier Interactive Patient Education  2018 [place] [facility] [employer] Calories are units of energy. Your body needs a certain amount of calories from food to keep you going throughout the day. When you eat more calories than your body needs, your body stores the extra calories as fat. When you eat fewer calories than your body needs, your body burns fat to get the energy it needs. Calorie counting means keeping track of how many calories you eat and drink each day. Calorie counting can be helpful if you need to lose weight. If you make sure to eat fewer calories than your body needs, you should lose weight. Ask your health care provider what a healthy weight is for you. For  calorie counting to work, you will need to eat the right number of calories in a day in order to lose a healthy amount of weight per week. A dietitian can help you determine how many calories you need in a day and will give you suggestions on how to reach your calorie goal.  A healthy amount of weight to lose per week is usually 1-2 lb (0.5-0.9 kg). This usually means that your daily calorie intake should be reduced by 500-750 calories.  Eating 1,200 - 1,500 calories per day can help most women lose weight.  Eating 1,500 - 1,800 calories per day can help most men lose weight.  What is my plan? My goal is to have __________ calories per day. If I have this many calories per day, I should lose around __________ pounds per week. What do I need to know about calorie counting? In order to meet your daily calorie goal, you will need to:  Find out how many calories are in each food you would like to eat. Try to do this before you eat.  Decide how much of the food you plan to eat.  Write down what you ate and how many calories it had. Doing this is called keeping a food log.  To successfully lose weight, it is important to balance calorie counting with a healthy lifestyle that includes regular activity. Aim for 150 minutes of moderate exercise (such as walking) or 75 minutes of vigorous exercise (such as running) each week. Where do I find calorie information?  The number of calories in a food can be found on a Nutrition Facts label. If a food does not have a Nutrition Facts label, try to look up the calories online or ask your dietitian for help. Remember that calories are listed per serving. If you choose to have more than one serving of a food, you will have to multiply the calories per serving by the amount of servings you plan to eat. For example, the label on a package of bread might say that a serving size is 1 slice and that there are 90 calories in a serving. If you eat 1 slice, you will  have eaten 90 calories. If you eat 2 slices, you will have eaten 180 calories. How do I keep a food log? Immediately after each meal, record the following information in your food log:  What you ate. Don't forget to include toppings, sauces, and other extras on the food.  How much you ate. This can be measured in cups, ounces, or number of items.  How many calories each food and drink had.  The total number of calories in the meal.  Keep your food log near you, such as in a small notebook in your pocket, or use a  mobile app or website. Some programs will calculate calories for you and show you how many calories you have left for the day to meet your goal. What are some calorie counting tips?  Use your calories on foods and drinks that will fill you up and not leave you hungry: ? Some examples of foods that fill you up are nuts and nut butters, vegetables, lean proteins, and high-fiber foods like whole grains. High-fiber foods are foods with more than 5 g fiber per serving. ? Drinks such as sodas, specialty coffee drinks, alcohol, and juices have a lot of calories, yet do not fill you up.  Eat nutritious foods and avoid empty calories. Empty calories are calories you get from foods or beverages that do not have many vitamins or protein, such as candy, sweets, and soda. It is better to have a nutritious high-calorie food (such as an avocado) than a food with few nutrients (such as a bag of chips).  Know how many calories are in the foods you eat most often. This will help you calculate calorie counts faster.  Pay attention to calories in drinks. Low-calorie drinks include water and unsweetened drinks.  Pay attention to nutrition labels for "low fat" or "fat free" foods. These foods sometimes have the same amount of calories or more calories than the full fat versions. They also often have added sugar, starch, or salt, to make up for flavor that was removed with the fat.  Find a way of  tracking calories that works for you. Get creative. Try different apps or programs if writing down calories does not work for you. What are some portion control tips?  Know how many calories are in a serving. This will help you know how many servings of a certain food you can have.  Use a measuring cup to measure serving sizes. You could also try weighing out portions on a kitchen scale. With time, you will be able to estimate serving sizes for some foods.  Take some time to put servings of different foods on your favorite plates, bowls, and cups so you know what a serving looks like.  Try not to eat straight from a bag or box. Doing this can lead to overeating. Put the amount you would like to eat in a cup or on a plate to make sure you are eating the right portion.  Use smaller plates, glasses, and bowls to prevent overeating.  Try not to multitask (for example, watch TV or use your computer) while eating. If it is time to eat, sit down at a table and enjoy your food. This will help you to know when you are full. It will also help you to be aware of what you are eating and how much you are eating. What are tips for following this plan? Reading food labels  Check the calorie count compared to the serving size. The serving size may be smaller than what you are used to eating.  Check the source of the calories. Make sure the food you are eating is high in vitamins and protein and low in saturated and trans fats. Shopping  Read nutrition labels while you shop. This will help you make healthy decisions before you decide to purchase your food.  Make a grocery list and stick to it. Cooking  Try to cook your favorite foods in a healthier way. For example, try baking instead of frying.  Use low-fat dairy products. Meal planning  Use more fruits and vegetables. Half  of your plate should be fruits and vegetables.  Include lean proteins like poultry and fish. How do I count calories when  eating out?  Ask for smaller portion sizes.  Consider sharing an entree and sides instead of getting your own entree.  If you get your own entree, eat only half. Ask for a box at the beginning of your meal and put the rest of your entree in it so you are not tempted to eat it.  If calories are listed on the menu, choose the lower calorie options.  Choose dishes that include vegetables, fruits, whole grains, low-fat dairy products, and lean protein.  Choose items that are boiled, broiled, grilled, or steamed. Stay away from items that are buttered, battered, fried, or served with cream sauce. Items labeled "crispy" are usually fried, unless stated otherwise.  Choose water, low-fat milk, unsweetened iced tea, or other drinks without added sugar. If you want an alcoholic beverage, choose a lower calorie option such as a glass of wine or light beer.  Ask for dressings, sauces, and syrups on the side. These are usually high in calories, so you should limit the amount you eat.  If you want a salad, choose a garden salad and ask for grilled meats. Avoid extra toppings like bacon, cheese, or fried items. Ask for the dressing on the side, or ask for olive oil and vinegar or lemon to use as dressing.  Estimate how many servings of a food you are given. For example, a serving of cooked rice is  cup or about the size of half a baseball. Knowing serving sizes will help you be aware of how much food you are eating at restaurants. The list below tells you how big or small some common portion sizes are based on everyday objects: ? 1 oz-4 stacked dice. ? 3 oz-1 deck of cards. ? 1 tsp-1 die. ? 1 Tbsp- a ping-pong ball. ? 2 Tbsp-1 ping-pong ball. ?  cup- baseball. ? 1 cup-1 baseball. Summary  Calorie counting means keeping track of how many calories you eat and drink each day. If you eat fewer calories than your body needs, you should lose weight.  A healthy amount of weight to lose per week is  usually 1-2 lb (0.5-0.9 kg). This usually means reducing your daily calorie intake by 500-750 calories.  The number of calories in a food can be found on a Nutrition Facts label. If a food does not have a Nutrition Facts label, try to look up the calories online or ask your dietitian for help.  Use your calories on foods and drinks that will fill you up, and not on foods and drinks that will leave you hungry.  Use smaller plates, glasses, and bowls to prevent overeating. This information is not intended to replace advice given to you by your health care provider. Make sure you discuss any questions you have with your health care provider. Document Released: 08/21/2005 Document Revised: 07/21/2016 Document Reviewed: 07/21/2016 Elsevier Interactive Patient Education  2017 [employer].

## 2017-02-20 ENCOUNTER — Encounter: Payer: Self-pay | Admitting: Certified Nurse Midwife

## 2017-02-20 LAB — LIPID PANEL
CHOLESTEROL TOTAL: 145 mg/dL (ref 100–199)
Chol/HDL Ratio: 2.6 ratio (ref 0.0–4.4)
HDL: 55 mg/dL (ref >39)
LDL CALC: 78 mg/dL (ref 0–99)
TRIGLYCERIDES: 59 mg/dL (ref 0–149)
VLDL CHOLESTEROL CAL: 12 mg/dL (ref 5–40)

## 2017-02-20 LAB — TSH: TSH: 1.36 u[IU]/mL (ref 0.450–4.500)

## 2017-02-21 LAB — PAP IG, CT-NG, RFX HPV ASCU
Chlamydia, Nuc. Acid Amp: NEGATIVE
GONOCOCCUS BY NUCLEIC ACID AMP: NEGATIVE
PAP SMEAR COMMENT: 0

## 2017-02-22 ENCOUNTER — Encounter: Payer: Self-pay | Admitting: Certified Nurse Midwife

## 2017-02-23 ENCOUNTER — Other Ambulatory Visit: Payer: Self-pay | Admitting: Family Medicine

## 2017-02-23 MED ORDER — TOPIRAMATE 50 MG PO TABS: 50.0000 mg | ORAL_TABLET | Freq: Every day | ORAL | 3 refills | Status: DC

## 2017-02-23 NOTE — Telephone Encounter (Signed)
Pt contacted office for refill request on the following medications:  topiramate (TOPAMAX) 50 MG tablet.  [facility]  CB#[id number]

## 2017-05-10 ENCOUNTER — Ambulatory Visit: Payer: Medicaid Other | Admitting: Family Medicine

## 2017-05-15 ENCOUNTER — Ambulatory Visit: Payer: Self-pay | Admitting: Family Medicine

## 2017-05-15 NOTE — Progress Notes (Deleted)
   Patient: Emily JarredKayleigh Simon Female    DOB: [birth year]   21 y.o.   MRN: [id number] Visit Date: 05/15/2017  Today's Provider: Dortha Kernennis Chrismon, PA   No chief complaint on file.  Subjective:    HPI Patient is here today to follow up on depression and migraines. Last Ov was on 11/07/2016. She has been taking Amitriptyline and Topamax. Symptoms have been   Previous Medications   AMITRIPTYLINE (ELAVIL) 50 MG TABLET    Take 1 tablet (50 mg total) by mouth at bedtime.   IBUPROFEN (ADVIL,MOTRIN) 600 MG TABLET    Take by mouth.   LEVONORGESTREL (MIRENA, 52 MG,) 20 MCG/24HR IUD    1 each by Intrauterine route once.   TOPIRAMATE (TOPAMAX) 50 MG TABLET    Take 1 tablet (50 mg total) by mouth daily.    Review of Systems  Constitutional: Negative.   Respiratory: Negative.   Cardiovascular: Negative.   Neurological: Positive for headaches.  Psychiatric/Behavioral: Positive for dysphoric mood.    Social History  Substance Use Topics  . Smoking status: Never Smoker  . Smokeless tobacco: Never Used  . Alcohol use No   Objective:   There were no vitals taken for this visit.  Physical Exam      Assessment & Plan:       Follow up: No Follow-up on file.

## 2017-06-13 ENCOUNTER — Other Ambulatory Visit: Payer: Self-pay | Admitting: Family Medicine

## 2017-07-30 ENCOUNTER — Other Ambulatory Visit: Payer: Self-pay | Admitting: Family Medicine

## 2017-07-30 DIAGNOSIS — G43809 Other migraine, not intractable, without status migrainosus: Secondary | ICD-10-CM

## 2017-07-30 DIAGNOSIS — F32A Depression, unspecified: Secondary | ICD-10-CM

## 2017-07-30 DIAGNOSIS — F329 Major depressive disorder, single episode, unspecified: Secondary | ICD-10-CM

## 2017-11-07 ENCOUNTER — Other Ambulatory Visit: Payer: Self-pay | Admitting: Family Medicine

## 2018-02-06 ENCOUNTER — Ambulatory Visit (INDEPENDENT_AMBULATORY_CARE_PROVIDER_SITE_OTHER): Payer: Managed Care, Other (non HMO)

## 2018-02-06 ENCOUNTER — Ambulatory Visit (INDEPENDENT_AMBULATORY_CARE_PROVIDER_SITE_OTHER): Payer: Managed Care, Other (non HMO) | Admitting: Certified Nurse Midwife

## 2018-02-06 VITALS — BP 110/74 | HR 88 | Ht 66.0 in | Wt 196.1 lb

## 2018-02-06 DIAGNOSIS — Z30431 Encounter for routine checking of intrauterine contraceptive device: Secondary | ICD-10-CM | POA: Diagnosis not present

## 2018-02-06 NOTE — Progress Notes (Signed)
GYNECOLOGY OFFICE PROGRESS NOTE  History:  22 y.o. G0P0000 here today for today for IUD string check; mirena IUD was placed 01/17/17. Complaints of cramping and bleeding on and off.   The following portions of the patient's history were reviewed and updated as appropriate: allergies, current medications, past family history, past medical history, past social history, past surgical history and problem list.    Review of Systems:  Pertinent items are noted in HPI.   Objective:  Physical Exam Blood pressure 110/74, pulse 88, height 5\' 6"  (1.676 m), weight 196 lb 1 oz (88.9 kg), last menstrual period 01/28/2018. CONSTITUTIONAL: Well-developed, well-nourished female in no acute distress.  HENT:  Normocephalic, atraumatic. External right and left ear normal. Oropharynx is clear and moist  NECK: Normal range of motion, supple, no masses CARDIOVASCULAR: Normal heart rate noted RESPIRATORY: Effort and breath sounds normal, no problems with respiration noted ABDOMEN: Soft, no distention noted.   PELVIC: Normal appearing external genitalia; normal appearing vaginal mucosa and cervix.  IUD strings visualized, about 3 cm in length outside cervix.   Assessment & Plan:  Normal IUD check. U/s for IUD placement at earliest appointment . Routine preventative health maintenance measures emphasized.  Doreene BurkeAnnie Alexandros Ewan, CNM

## 2018-02-06 NOTE — Progress Notes (Signed)
Pt has questions about Mirena.

## 2018-02-06 NOTE — Patient Instructions (Signed)

## 2018-02-08 ENCOUNTER — Encounter (INDEPENDENT_AMBULATORY_CARE_PROVIDER_SITE_OTHER): Payer: Self-pay

## 2018-02-20 ENCOUNTER — Ambulatory Visit (INDEPENDENT_AMBULATORY_CARE_PROVIDER_SITE_OTHER): Payer: Managed Care, Other (non HMO) | Admitting: Certified Nurse Midwife

## 2018-02-20 ENCOUNTER — Encounter: Payer: Self-pay | Admitting: Certified Nurse Midwife

## 2018-02-20 VITALS — BP 111/74 | HR 79 | Ht 66.0 in | Wt 194.6 lb

## 2018-02-20 DIAGNOSIS — Z01419 Encounter for gynecological examination (general) (routine) without abnormal findings: Secondary | ICD-10-CM

## 2018-02-20 DIAGNOSIS — Z124 Encounter for screening for malignant neoplasm of cervix: Secondary | ICD-10-CM | POA: Diagnosis not present

## 2018-02-20 LAB — HM PAP SMEAR

## 2018-02-20 NOTE — Patient Instructions (Signed)
Preventive Care 18-39 Years, Female Preventive care refers to lifestyle choices and visits with your health care provider that can promote health and wellness. What does preventive care include?  A yearly physical exam. This is also called an annual well check.  Dental exams once or twice a year.  Routine eye exams. Ask your health care provider how often you should have your eyes checked.  Personal lifestyle choices, including: ? Daily care of your teeth and gums. ? Regular physical activity. ? Eating a healthy diet. ? Avoiding tobacco and drug use. ? Limiting alcohol use. ? Practicing safe sex. ? Taking vitamin and mineral supplements as recommended by your health care provider. What happens during an annual well check? The services and screenings done by your health care provider during your annual well check will depend on your age, overall health, lifestyle risk factors, and family history of disease. Counseling Your health care provider may ask you questions about your:  Alcohol use.  Tobacco use.  Drug use.  Emotional well-being.  Home and relationship well-being.  Sexual activity.  Eating habits.  Work and work Statistician.  Method of birth control.  Menstrual cycle.  Pregnancy history.  Screening You may have the following tests or measurements:  Height, weight, and BMI.  Diabetes screening. This is done by checking your blood sugar (glucose) after you have not eaten for a while (fasting).  Blood pressure.  Lipid and cholesterol levels. These may be checked every 5 years starting at age 22.  Skin check.  Hepatitis C blood test.  Hepatitis B blood test.  Sexually transmitted disease (STD) testing.  BRCA-related cancer screening. This may be done if you have a family history of breast, ovarian, tubal, or peritoneal cancers.  Pelvic exam and Pap test. This may be done every 3 years starting at age 22. Starting at age 22, this may be done every 5  years if you have a Pap test in combination with an HPV test.  Discuss your test results, treatment options, and if necessary, the need for more tests with your health care provider. Vaccines Your health care provider may recommend certain vaccines, such as:  Influenza vaccine. This is recommended every year.  Tetanus, diphtheria, and acellular pertussis (Tdap, Td) vaccine. You may need a Td booster every 10 years.  Varicella vaccine. You may need this if you have not been vaccinated.  HPV vaccine. If you are 22 or younger, you may need three doses over 6 months.  Measles, mumps, and rubella (MMR) vaccine. You may need at least one dose of MMR. You may also need a second dose.  Pneumococcal 13-valent conjugate (PCV13) vaccine. You may need this if you have certain conditions and were not previously vaccinated.  Pneumococcal polysaccharide (PPSV23) vaccine. You may need one or two doses if you smoke cigarettes or if you have certain conditions.  Meningococcal vaccine. One dose is recommended if you are age 22-22 years and a first-year college student living in a residence hall, or if you have one of several medical conditions. You may also need additional booster doses.  Hepatitis A vaccine. You may need this if you have certain conditions or if you travel or work in places where you may be exposed to hepatitis A.  Hepatitis B vaccine. You may need this if you have certain conditions or if you travel or work in places where you may be exposed to hepatitis B.  Haemophilus influenzae type b (Hib) vaccine. You may need this if  you have certain risk factors.  Talk to your health care provider about which screenings and vaccines you need and how often you need them. This information is not intended to replace advice given to you by your health care provider. Make sure you discuss any questions you have with your health care provider. Document Released: 10/17/2001 Document Revised: 05/10/2016  Document Reviewed: 06/22/2015 Elsevier Interactive Patient Education  [employer].

## 2018-02-20 NOTE — Progress Notes (Signed)
Pt is here for a physical
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
## 2018-03-11 ENCOUNTER — Telehealth: Payer: Self-pay | Admitting: Family Medicine

## 2018-03-11 NOTE — Telephone Encounter (Signed)
Pt will run out of her pres

## 2018-03-12 ENCOUNTER — Ambulatory Visit (INDEPENDENT_AMBULATORY_CARE_PROVIDER_SITE_OTHER): Payer: Managed Care, Other (non HMO) | Admitting: Family Medicine

## 2018-03-12 ENCOUNTER — Encounter: Payer: Self-pay | Admitting: Family Medicine

## 2018-03-12 VITALS — BP 110/70 | HR 100 | Temp 98.2°F | Ht 66.0 in | Wt 194.0 lb

## 2018-03-12 DIAGNOSIS — F32A Depression, unspecified: Secondary | ICD-10-CM

## 2018-03-12 DIAGNOSIS — F329 Major depressive disorder, single episode, unspecified: Secondary | ICD-10-CM | POA: Diagnosis not present

## 2018-03-12 DIAGNOSIS — G43809 Other migraine, not intractable, without status migrainosus: Secondary | ICD-10-CM | POA: Diagnosis not present

## 2018-03-12 DIAGNOSIS — Z23 Encounter for immunization: Secondary | ICD-10-CM | POA: Diagnosis not present

## 2018-03-12 MED ORDER — TOPIRAMATE 50 MG PO TABS: 50.0000 mg | ORAL_TABLET | Freq: Every day | ORAL | 6 refills | Status: DC

## 2018-03-12 MED ORDER — AMITRIPTYLINE HCL 50 MG PO TABS: 50.0000 mg | ORAL_TABLET | Freq: Every day | ORAL | 6 refills | Status: DC
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
## 2018-03-15 LAB — CBC WITH DIFFERENTIAL/PLATELET
BASOS ABS: 0 10*3/uL (ref 0.0–0.2)
BASOS: 1 %
EOS (ABSOLUTE): 0.2 10*3/uL (ref 0.0–0.4)
Eos: 4 %
Hematocrit: 39.3 % (ref 34.0–46.6)
Hemoglobin: 13.3 g/dL (ref 11.1–15.9)
Immature Grans (Abs): 0 10*3/uL (ref 0.0–0.1)
Immature Granulocytes: 0 %
LYMPHS: 33 %
Lymphocytes Absolute: 2.1 10*3/uL (ref 0.7–3.1)
MCH: 32.6 pg (ref 26.6–33.0)
MCHC: 33.8 g/dL (ref 31.5–35.7)
MCV: 96 fL (ref 79–97)
MONOS ABS: 0.6 10*3/uL (ref 0.1–0.9)
Monocytes: 10 %
NEUTROS ABS: 3.4 10*3/uL (ref 1.4–7.0)
Neutrophils: 52 %
PLATELETS: 273 10*3/uL (ref 150–450)
RBC: 4.08 x10E6/uL (ref 3.77–5.28)
RDW: 11.4 % — AB (ref 12.3–15.4)
WBC: 6.3 10*3/uL (ref 3.4–10.8)

## 2018-03-15 LAB — COMPREHENSIVE METABOLIC PANEL
A/G RATIO: 2 (ref 1.2–2.2)
ALT: 7 IU/L (ref 0–32)
AST: 11 IU/L (ref 0–40)
Albumin: 4 g/dL (ref 3.5–5.5)
Alkaline Phosphatase: 59 IU/L (ref 39–117)
BUN/Creatinine Ratio: 15 (ref 9–23)
BUN: 13 mg/dL (ref 6–20)
Bilirubin Total: 0.8 mg/dL (ref 0.0–1.2)
CO2: 20 mmol/L (ref 20–29)
CREATININE: 0.84 mg/dL (ref 0.57–1.00)
Calcium: 9.1 mg/dL (ref 8.7–10.2)
Chloride: 107 mmol/L — ABNORMAL HIGH (ref 96–106)
GFR calc Af Amer: 114 mL/min/{1.73_m2} (ref >59)
GFR, EST NON AFRICAN AMERICAN: 99 mL/min/{1.73_m2} (ref >59)
Globulin, Total: 2 g/dL (ref 1.5–4.5)
Glucose: 79 mg/dL (ref 65–99)
Potassium: 4.1 mmol/L (ref 3.5–5.2)
SODIUM: 143 mmol/L (ref 134–144)
TOTAL PROTEIN: 6 g/dL (ref 6.0–8.5)

## 2018-03-15 LAB — TSH: TSH: 1.98 u[IU]/mL (ref 0.450–4.500)

## 2018-03-18 ENCOUNTER — Telehealth: Payer: Self-pay

## 2018-03-18 NOTE — Telephone Encounter (Signed)
-----   Message from Tamsen Roersennis E Chrismon, [place] sent at 03/15/2018  5:54 PM EDT ----- All blood tests essentially normal. Recheck in 6 months.

## 2018-03-18 NOTE — Telephone Encounter (Signed)
LMTCB 07/15/20109  Thanks,   -Vernona RiegerLaura

## 2018-03-20 NOTE — Telephone Encounter (Signed)
LMTCB 03/20/2018   Thanks,   -Havanna Groner 

## 2018-04-03 ENCOUNTER — Ambulatory Visit (INDEPENDENT_AMBULATORY_CARE_PROVIDER_SITE_OTHER): Payer: Managed Care, Other (non HMO) | Admitting: Family Medicine

## 2018-04-03 ENCOUNTER — Encounter: Payer: Self-pay | Admitting: Family Medicine

## 2018-04-03 VITALS — BP 106/72 | HR 88 | Temp 98.4°F | Resp 15 | Wt 195.0 lb

## 2018-04-03 DIAGNOSIS — N3091 Cystitis, unspecified with hematuria: Secondary | ICD-10-CM | POA: Diagnosis not present

## 2018-04-03 LAB — POCT URINALYSIS DIPSTICK
Blood, UA: NEGATIVE
Glucose, UA: NEGATIVE
PH UA: 7 (ref 5.0–8.0)
Spec Grav, UA: <=1.005 — AB (ref 1.010–1.025)

## 2018-04-03 MED ORDER — CEPHALEXIN 500 MG PO CAPS: 500.0000 mg | ORAL_CAPSULE | Freq: Two times a day (BID) | ORAL | 0 refills | Status: DC

## 2018-04-03 NOTE — Progress Notes (Signed)
  Subjective:     Patient ID: Emily JarredKayleigh Simon, female   DOB: 1995-11-13, 22 y.o.   MRN: [id number] Chief Complaint  Patient presents with  . Cystitis    Patient comes in office today with complaints of burning with urination, frequency and blood in urine since 03/30/18, she has taken otc Azo for relief.     HPI Reports chills at onset but no fever. Remote history or prior UTI.  Review of Systems     Objective:   Physical Exam  Constitutional: She appears well-developed and well-nourished. No distress.  Genitourinary:  Genitourinary Comments: No c.v.a. tenderness       Assessment:    1. Cystitis with hematuria - Urine Culture - POCT urinalysis dipstick - cephALEXin (KEFLEX) 500 MG capsule; Take 1 capsule (500 mg total) by mouth 2 (two) times daily.  Dispense: 14 capsule; Refill: 0    Plan:    Further f/u pending culture results. Discussed increased fluid intake and voiding after intercourse.

## 2018-04-03 NOTE — Patient Instructions (Signed)
We will call you with the urine culture report. May stop antibiotic after 6 pills if better.

## 2018-04-05 ENCOUNTER — Telehealth: Payer: Self-pay

## 2018-04-05 LAB — URINE CULTURE

## 2018-04-05 NOTE — Telephone Encounter (Signed)
-----   Message from Anola Gurneyobert Chauvin, [place] sent at 04/05/2018  7:33 AM EDT ----- No specific organism on urine culture. Is she feeling better on the antibiotic?

## 2018-04-05 NOTE — Telephone Encounter (Signed)
Tried calling patient, and no answer. Will try again later.  

## 2018-04-09 NOTE — Telephone Encounter (Signed)
Regular visit as I need to see if STD is an issue.

## 2018-04-09 NOTE — Telephone Encounter (Signed)
appt scheduled for Thursday. KW

## 2018-04-09 NOTE — Telephone Encounter (Signed)
Return to the office this week for repeat testing of urine.pp

## 2018-04-09 NOTE — Telephone Encounter (Signed)
Please advise. KW 

## 2018-04-09 NOTE — Telephone Encounter (Signed)
As a nurse visit or do you want me to schedule office visit? KW

## 2018-04-09 NOTE — Telephone Encounter (Signed)
Pt returning call

## 2018-04-09 NOTE — Telephone Encounter (Signed)
Advised patient of results. Patient called and said that she is not feeling any better.

## 2018-04-11 ENCOUNTER — Ambulatory Visit (INDEPENDENT_AMBULATORY_CARE_PROVIDER_SITE_OTHER): Payer: Managed Care, Other (non HMO) | Admitting: Family Medicine

## 2018-04-11 ENCOUNTER — Encounter: Payer: Self-pay | Admitting: Family Medicine

## 2018-04-11 VITALS — BP 120/82 | HR 91 | Temp 98.6°F | Resp 16 | Wt 193.8 lb

## 2018-04-11 DIAGNOSIS — R300 Dysuria: Secondary | ICD-10-CM | POA: Diagnosis not present

## 2018-04-11 LAB — POCT URINALYSIS DIPSTICK
Bilirubin, UA: NEGATIVE
Blood, UA: NEGATIVE
Glucose, UA: NEGATIVE
KETONES UA: NEGATIVE
Leukocytes, UA: NEGATIVE
NITRITE UA: NEGATIVE
PROTEIN UA: NEGATIVE
Spec Grav, UA: 1.015 (ref 1.010–1.025)
Urobilinogen, UA: 0.2 E.U./dL
pH, UA: 6 (ref 5.0–8.0)

## 2018-04-11 NOTE — Progress Notes (Signed)
  Subjective:     Patient ID: Emily Simon, female   DOB: 06-Jan-1996, 22 y.o.   MRN: [id number] Chief Complaint  Patient presents with  . Urinary Tract Infection    Patient returns back to office today for follow up from 04/03/18. Patient states that she completed the Keflex antibiotic prescribed to treat UTI but states that symptoms never fully cleared. Patient reports dysuria only in the PM, she denies hematuria or frequency.    HPI Urine culture without evidence of infection. No vaginal discharge. Sexually active with one partner and reports condom use.  Review of Systems     Objective:   Physical Exam  Constitutional: She appears well-developed and well-nourished. No distress.       Assessment:    1. Dysuria - POCT urinalysis dipstick    Plan:    Continue with increased fluids and otc urinary analgesic as needed. Call for signs and sx of yeast infection which were discussed.

## 2018-04-11 NOTE — Patient Instructions (Signed)
Continue to push fluids and use urinary pain relief for two day if needed. If you develop signs of yeast infection with itching or creamy discharge call and let us know.
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
## 2018-09-12 ENCOUNTER — Encounter: Payer: Self-pay | Admitting: Family Medicine

## 2018-09-12 ENCOUNTER — Ambulatory Visit: Payer: Self-pay | Admitting: Family Medicine

## 2018-09-12 VITALS — BP 90/50 | HR 94 | Temp 98.4°F | Resp 16 | Ht 66.0 in | Wt 199.0 lb

## 2018-09-12 DIAGNOSIS — F329 Major depressive disorder, single episode, unspecified: Secondary | ICD-10-CM

## 2018-09-12 DIAGNOSIS — F32A Depression, unspecified: Secondary | ICD-10-CM

## 2018-09-12 DIAGNOSIS — G43809 Other migraine, not intractable, without status migrainosus: Secondary | ICD-10-CM

## 2018-09-12 DIAGNOSIS — E210 Primary hyperparathyroidism: Secondary | ICD-10-CM

## 2018-10-14 ENCOUNTER — Other Ambulatory Visit: Payer: Self-pay | Admitting: Family Medicine

## 2018-10-14 DIAGNOSIS — G43809 Other migraine, not intractable, without status migrainosus: Secondary | ICD-10-CM

## 2018-10-14 DIAGNOSIS — F32A Depression, unspecified: Secondary | ICD-10-CM

## 2018-10-14 DIAGNOSIS — F329 Major depressive disorder, single episode, unspecified: Secondary | ICD-10-CM

## 2018-11-16 ENCOUNTER — Other Ambulatory Visit: Payer: Self-pay | Admitting: Family Medicine

## 2019-02-21 ENCOUNTER — Telehealth: Payer: Self-pay

## 2019-02-21 NOTE — Telephone Encounter (Signed)
Pt called no answer LM via voicemail to call the office for prescreening. 

## 2019-02-21 NOTE — Telephone Encounter (Signed)
Coronavirus (COVID-19) Are you at risk?  Are you at risk for the Coronavirus (COVID-19)?  To be considered HIGH RISK for Coronavirus (COVID-19), you have to meet the following criteria:  . Traveled to [place], [place], [place], [place] or [place]; or in the [place] to [place], [place], [place], or [place]; and have fever, cough, and shortness of breath within the last 2 weeks of travel OR . Been in close contact with a person diagnosed with COVID-19 within the last 2 weeks and have fever, cough, and shortness of breath . IF YOU DO NOT MEET THESE CRITERIA, YOU ARE CONSIDERED LOW RISK FOR COVID-19.  What to do if you are HIGH RISK for COVID-19?  . If you are having a medical emergency, call 911. . Seek medical care right away. Before you go to a doctor's office, urgent care or emergency department, call ahead and tell them about your recent travel, contact with someone diagnosed with COVID-19, and your symptoms. You should receive instructions from your physician's office regarding next steps of care.  . When you arrive at healthcare provider, tell the healthcare staff immediately you have returned from visiting [place], [place], [place], [place] or [place]; or traveled in the [place] to [place], [place], [place], or [place]; in the last two weeks or you have been in close contact with a person diagnosed with COVID-19 in the last 2 weeks.   . Tell the health care staff about your symptoms: fever, cough and shortness of breath. . After you have been seen by a medical provider, you will be either: o Tested for (COVID-19) and discharged home on quarantine except to seek medical care if symptoms worsen, and asked to  - Stay home and avoid contact with others until you get your results (4-5 days)  - Avoid travel on public transportation if possible (such as bus, train, or airplane) or o Sent to the Emergency Department by EMS for evaluation, COVID-19 testing, and possible  admission depending on your condition and test results.  What to do if you are LOW RISK for COVID-19?  Reduce your risk of any infection by using the same precautions used for avoiding the common cold or flu:  . Wash your hands often with soap and warm water for at least 20 seconds.  If soap and water are not readily available, use an alcohol-based hand sanitizer with at least 60% alcohol.  . If coughing or sneezing, cover your mouth and nose by coughing or sneezing into the elbow areas of your shirt or coat, into a tissue or into your sleeve (not your hands). . Avoid shaking hands with others and consider head nods or verbal greetings only. . Avoid touching your eyes, nose, or mouth with unwashed hands.  . Avoid close contact with people who are sick. . Avoid places or events with large numbers of people in one location, like concerts or sporting events. . Carefully consider travel plans you have or are making. . If you are planning any travel outside or inside the [place], visit the CDC's Travelers' Health webpage for the latest health notices. . If you have some symptoms but not all symptoms, continue to monitor at home and seek medical attention if your symptoms worsen. . If you are having a medical emergency, call 911.   ADDITIONAL HEALTHCARE OPTIONS FOR PATIENTS  [place] [facility] / e-Visit: [email or web address]         [facility]: [phone]  [facility] [place]   [facility]: [phone]                   [facility]: [phone]   Pre-screen negative, DM.   

## 2019-02-24 ENCOUNTER — Other Ambulatory Visit: Payer: Self-pay

## 2019-02-24 ENCOUNTER — Other Ambulatory Visit (HOSPITAL_COMMUNITY)
Admission: RE | Admit: 2019-02-24 | Discharge: 2019-02-24 | Disposition: A | Discharge status: Home / Self Care | Payer: Self-pay | Source: Ambulatory Visit | Attending: Certified Nurse Midwife | Admitting: Certified Nurse Midwife

## 2019-02-24 ENCOUNTER — Ambulatory Visit (INDEPENDENT_AMBULATORY_CARE_PROVIDER_SITE_OTHER): Payer: Medicaid Other | Admitting: Certified Nurse Midwife

## 2019-02-24 ENCOUNTER — Encounter: Payer: Self-pay | Admitting: Certified Nurse Midwife

## 2019-02-24 VITALS — BP 104/76 | HR 89 | Ht 66.0 in | Wt 206.2 lb

## 2019-02-24 DIAGNOSIS — Z0000 Encounter for general adult medical examination without abnormal findings: Secondary | ICD-10-CM

## 2019-02-24 DIAGNOSIS — Z124 Encounter for screening for malignant neoplasm of cervix: Secondary | ICD-10-CM | POA: Insufficient documentation

## 2019-02-24 DIAGNOSIS — Z01419 Encounter for gynecological examination (general) (routine) without abnormal findings: Secondary | ICD-10-CM | POA: Insufficient documentation

## 2019-02-24 NOTE — Addendum Note (Signed)
Addended by: Hildred Priest on: 02/24/2019 09:32 AM   Modules accepted: Orders
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
## 2019-02-24 NOTE — Patient Instructions (Signed)
Preventive Care 18-39 Years, Female Preventive care refers to lifestyle choices and visits with your health care provider that can promote health and wellness. What does preventive care include?   A yearly physical exam. This is also called an annual well check.  Dental exams once or twice a year.  Routine eye exams. Ask your health care provider how often you should have your eyes checked.  Personal lifestyle choices, including: ? Daily care of your teeth and gums. ? Regular physical activity. ? Eating a healthy diet. ? Avoiding tobacco and drug use. ? Limiting alcohol use. ? Practicing safe sex. ? Taking vitamin and mineral supplements as recommended by your health care provider. What happens during an annual well check? The services and screenings done by your health care provider during your annual well check will depend on your age, overall health, lifestyle risk factors, and family history of disease. Counseling Your health care provider may ask you questions about your:  Alcohol use.  Tobacco use.  Drug use.  Emotional well-being.  Home and relationship well-being.  Sexual activity.  Eating habits.  Work and work Statistician.  Method of birth control.  Menstrual cycle.  Pregnancy history. Screening You may have the following tests or measurements:  Height, weight, and BMI.  Diabetes screening. This is done by checking your blood sugar (glucose) after you have not eaten for a while (fasting).  Blood pressure.  Lipid and cholesterol levels. These may be checked every 5 years starting at age 90.  Skin check.  Hepatitis C blood test.  Hepatitis B blood test.  Sexually transmitted disease (STD) testing.  BRCA-related cancer screening. This may be done if you have a family history of breast, ovarian, tubal, or peritoneal cancers.  Pelvic exam and Pap test. This may be done every 3 years starting at age 56. Starting at age 10, this may be done every 5  years if you have a Pap test in combination with an HPV test. Discuss your test results, treatment options, and if necessary, the need for more tests with your health care provider. Vaccines Your health care provider may recommend certain vaccines, such as:  Influenza vaccine. This is recommended every year.  Tetanus, diphtheria, and acellular pertussis (Tdap, Td) vaccine. You may need a Td booster every 10 years.  Varicella vaccine. You may need this if you have not been vaccinated.  HPV vaccine. If you are 23 or younger, you may need three doses over 6 months.  Measles, mumps, and rubella (MMR) vaccine. You may need at least one dose of MMR. You may also need a second dose.  Pneumococcal 13-valent conjugate (PCV13) vaccine. You may need this if you have certain conditions and were not previously vaccinated.  Pneumococcal polysaccharide (PPSV23) vaccine. You may need one or two doses if you smoke cigarettes or if you have certain conditions.  Meningococcal vaccine. One dose is recommended if you are age 73-21 years and a first-year college student living in a residence hall, or if you have one of several medical conditions. You may also need additional booster doses.  Hepatitis A vaccine. You may need this if you have certain conditions or if you travel or work in places where you may be exposed to hepatitis A.  Hepatitis B vaccine. You may need this if you have certain conditions or if you travel or work in places where you may be exposed to hepatitis B.  Haemophilus influenzae type b (Hib) vaccine. You may need this if you  have certain risk factors. Talk to your health care provider about which screenings and vaccines you need and how often you need them. This information is not intended to replace advice given to you by your health care provider. Make sure you discuss any questions you have with your health care provider. Document Released: 10/17/2001 Document Revised: 04/03/2017  Document Reviewed: 06/22/2015 Elsevier Interactive Patient Education  2019 [employer].

## 2019-02-27 LAB — CYTOLOGY - PAP: Diagnosis: NEGATIVE

## 2019-06-10 ENCOUNTER — Other Ambulatory Visit: Payer: Self-pay | Admitting: Family Medicine

## 2019-06-10 DIAGNOSIS — F329 Major depressive disorder, single episode, unspecified: Secondary | ICD-10-CM

## 2019-06-10 DIAGNOSIS — F32A Depression, unspecified: Secondary | ICD-10-CM

## 2019-06-10 DIAGNOSIS — G43809 Other migraine, not intractable, without status migrainosus: Secondary | ICD-10-CM

## 2019-07-14 ENCOUNTER — Other Ambulatory Visit: Payer: Self-pay | Admitting: Family Medicine

## 2019-07-14 DIAGNOSIS — F329 Major depressive disorder, single episode, unspecified: Secondary | ICD-10-CM

## 2019-07-14 DIAGNOSIS — F32A Depression, unspecified: Secondary | ICD-10-CM

## 2019-07-14 DIAGNOSIS — G43809 Other migraine, not intractable, without status migrainosus: Secondary | ICD-10-CM

## 2019-07-25 ENCOUNTER — Encounter: Payer: Self-pay | Admitting: Family Medicine

## 2019-07-25 ENCOUNTER — Other Ambulatory Visit: Payer: Self-pay

## 2019-07-25 ENCOUNTER — Ambulatory Visit (INDEPENDENT_AMBULATORY_CARE_PROVIDER_SITE_OTHER): Payer: 59 | Admitting: Family Medicine

## 2019-07-25 VITALS — BP 95/66 | HR 94 | Temp 96.8°F | Resp 16 | Ht 66.0 in | Wt 201.0 lb

## 2019-07-25 DIAGNOSIS — L720 Epidermal cyst: Secondary | ICD-10-CM | POA: Diagnosis not present

## 2019-07-25 MED ORDER — CEPHALEXIN 250 MG PO CAPS: 250.0000 mg | ORAL_CAPSULE | Freq: Three times a day (TID) | ORAL | 0 refills | Status: DC
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
## 2019-07-25 NOTE — Patient Instructions (Signed)
Epidermal Cyst  An epidermal cyst is a small, painless lump under your skin. The cyst contains a grayish-white, bad-smelling substance (keratin). Do not try to pop or open an epidermal cyst yourself. What are the causes?  A blocked hair follicle.  A hair that curls and re-enters the skin instead of growing straight out of the skin.  A blocked pore.  Irritated skin.  An injury to the skin.  Certain conditions that are passed along from parent to child (inherited).  Human papillomavirus (HPV).  Long-term sun damage to the skin. What increases the risk?  Having acne.  Being overweight.  Being 30-40 years old. What are the signs or symptoms? These cysts are usually harmless, but they can get infected. Symptoms of infection may include:  Redness.  Inflammation.  Tenderness.  Warmth.  Fever.  A grayish-white, bad-smelling substance drains from the cyst.  Pus drains from the cyst. How is this treated? In many cases, epidermal cysts go away on their own without treatment. If a cyst becomes infected, treatment may include:  Opening and draining the cyst, done by a doctor. After draining, you may need minor surgery to remove the rest of the cyst.  Antibiotic medicine.  Shots of medicines (steroids) that help to reduce inflammation.  Surgery to remove the cyst. Surgery may be done if the cyst: ? Becomes large. ? Bothers you. ? Has a chance of turning into cancer.  Do not try to open a cyst yourself. Follow these instructions at home:  Take over-the-counter and prescription medicines only as told by your doctor.  If you were prescribed an antibiotic medicine, take it it as told by your doctor. Do not stop using the antibiotic even if you start to feel better.  Keep the area around your cyst clean and dry.  Wear loose, dry clothing.  Avoid touching your cyst.  Check your cyst every day for signs of infection. Check for: ? Redness, swelling, or pain. ? Fluid  or blood. ? Warmth. ? Pus or a bad smell.  Keep all follow-up visits as told by your doctor. This is important. How is this prevented?  Wear clean, dry, clothing.  Avoid wearing tight clothing.  Keep your skin clean and dry. Take showers or baths every day. Contact a doctor if:  Your cyst has symptoms of infection.  Your condition does not improve or gets worse.  You have a cyst that looks different from other cysts you have had.  You have a fever. Get help right away if:  Redness spreads from the cyst into the area close by. Summary  An epidermal cyst is a sac made of skin tissue.  If a cyst becomes infected, treatment may include surgery to open and drain the cyst, or to remove it.  Take over-the-counter and prescription medicines only as told by your doctor.  Contact a doctor if your condition is not improving or is getting worse.  Keep all follow-up visits as told by your doctor. This is important. This information is not intended to replace advice given to you by your health care provider. Make sure you discuss any questions you have with your health care provider. Document Released: 09/28/2004 Document Revised: 12/12/2018 Document Reviewed: 05/30/2018 Elsevier Patient Education  2020 [employer]  

## 2019-07-30 ENCOUNTER — Other Ambulatory Visit: Payer: Self-pay

## 2019-07-30 DIAGNOSIS — Z20822 Contact with and (suspected) exposure to covid-19: Secondary | ICD-10-CM

## 2019-07-31 LAB — NOVEL CORONAVIRUS, NAA: SARS-CoV-2, NAA: NOT DETECTED

## 2019-08-21 ENCOUNTER — Encounter: Payer: Self-pay | Admitting: Adult Health

## 2019-08-21 ENCOUNTER — Ambulatory Visit (INDEPENDENT_AMBULATORY_CARE_PROVIDER_SITE_OTHER): Payer: Managed Care, Other (non HMO) | Admitting: Adult Health

## 2019-08-21 ENCOUNTER — Other Ambulatory Visit: Payer: Self-pay

## 2019-08-21 DIAGNOSIS — Z20822 Contact with and (suspected) exposure to covid-19: Secondary | ICD-10-CM

## 2019-08-21 DIAGNOSIS — R05 Cough: Secondary | ICD-10-CM | POA: Diagnosis not present

## 2019-08-21 DIAGNOSIS — Z20828 Contact with and (suspected) exposure to other viral communicable diseases: Secondary | ICD-10-CM

## 2019-08-21 DIAGNOSIS — J0100 Acute maxillary sinusitis, unspecified: Secondary | ICD-10-CM | POA: Diagnosis not present

## 2019-08-21 DIAGNOSIS — R059 Cough, unspecified: Secondary | ICD-10-CM

## 2019-08-21 DIAGNOSIS — R0989 Other specified symptoms and signs involving the circulatory and respiratory systems: Secondary | ICD-10-CM | POA: Diagnosis not present

## 2019-08-21 MED ORDER — PREDNISONE 10 MG (21) PO TBPK: ORAL_TABLET | ORAL | 0 refills | Status: DC

## 2019-08-21 MED ORDER — BENZONATATE 100 MG PO CAPS: 100.0000 mg | ORAL_CAPSULE | Freq: Three times a day (TID) | ORAL | 0 refills | Status: DC

## 2019-08-21 MED ORDER — AMOXICILLIN-POT CLAVULANATE 875-125 MG PO TABS: 1.0000 | ORAL_TABLET | Freq: Two times a day (BID) | ORAL | 0 refills | Status: DC

## 2019-08-21 NOTE — Progress Notes (Signed)
Patient: Emily Simon Female    DOB: [birth year]   23 y.o.   MRN: [id number] Visit Date: 08/21/2019  Today's Provider: Marcille Buffy, FNP   Chief Complaint  Patient presents with  . URI   Subjective:     Virtual Visit via Video Note  I connected with Emily Simon on 08/21/19 at  3:00 PM EST by a video enabled telemedicine application and verified that I am speaking with the correct person using two identifiers.  Location: Patient: at home  Provider: Provider: Provider's office at  [facility], [place].      I discussed the limitations of evaluation and management by telemedicine and the availability of in person appointments. The patient expressed understanding and agreed to proceed.   I discussed the assessment and treatment plan with the patient. The patient was provided an opportunity to ask questions and all were answered. The patient agreed with the plan and demonstrated an understanding of the instructions.   The patient was advised to call back or seek an in-person evaluation if the symptoms worsen or if the condition fails to improve as anticipated.  I provided  15 minutes of non-face-to-face time during this encounter.   URI  This is a new problem. The current episode started 1 to 4 weeks ago. The problem has been gradually worsening. There has been no fever. Associated symptoms include congestion, coughing, ear pain, rhinorrhea, sneezing and a sore throat. Pertinent negatives include no abdominal pain, chest pain, diarrhea, dysuria, headaches, joint pain, joint swelling, nausea, neck pain, plugged ear sensation, rash, sinus pain, swollen glands, vomiting or wheezing. She has tried antihistamine and decongestant for the symptoms. The treatment provided no relief.   Symptoms started 2 weeks ago, sore throat, denies any exudate. She feels as if she is now getting chest congestion and this is new. She denies any wheezing. Denies any  respiratory issues such as asthma. She has increasing chest congestions with chest tightness that is relieved with productive cough yellow sputum. Denies any chest pain. Denies any shortness of breath. Has sinus pain and sinus pressure bilateral cheeks.  She reports she can walk 15 plus feet without distress.  No pulse oximetry available.   Patient  denies any fever, body aches,chills, rash, chest pain, shortness of breath, nausea, vomiting, or diarrhea.   She was tested for [facility] 07/30/2019. was negative. She works in [employer], [employer]. Deals with public. She has no known exposure. No exposure in the home.   Allergies  Allergen Reactions  . Imitrex  [Sumatriptan] Other (See Comments)    Other Reaction: worsened symptoms acute worsening of migraine, jaw pain  . Codeine Hives and Itching  . Other Other (See Comments)    Seasonal allergies  . Red Dye Nausea And Vomiting     Current Outpatient Medications:  .  amitriptyline (ELAVIL) 50 MG tablet, TAKE ONE TABLET BY MOUTH AT BEDTIME, Disp: 30 tablet, Rfl: 1 .  cephALEXin (KEFLEX) 250 MG capsule, Take 1 capsule (250 mg total) by mouth 3 (three) times daily., Disp: 21 capsule, Rfl: 0 .  ibuprofen (ADVIL,MOTRIN) 600 MG tablet, Take by mouth., Disp: , Rfl:  .  levonorgestrel (MIRENA, 52 MG,) 20 MCG/24HR IUD, 1 each by Intrauterine route once., Disp: , Rfl:  .  topiramate (TOPAMAX) 50 MG tablet, TAKE ONE TABLET BY MOUTH EVERY DAY, Disp: 30 tablet, Rfl: 1  Review of Systems  Constitutional: Positive for fatigue. Negative for activity change, appetite change, chills,  diaphoresis, fever and unexpected weight change.  HENT: Positive for congestion, ear pain, postnasal drip, rhinorrhea, sinus pressure, sneezing and sore throat. Negative for dental problem, drooling, ear discharge, facial swelling, hearing loss, mouth sores, nosebleeds, sinus pain, tinnitus, trouble swallowing and voice change.   Eyes: Negative.   Respiratory: Positive for cough and chest  tightness. Negative for apnea, choking, shortness of breath, wheezing and stridor.   Cardiovascular: Negative for chest pain, palpitations and leg swelling.  Gastrointestinal: Negative for abdominal distention, abdominal pain, anal bleeding, blood in stool, constipation, diarrhea, nausea, rectal pain and vomiting.  Genitourinary: Negative.  Negative for dysuria.  Musculoskeletal: Negative.  Negative for joint pain and neck pain.  Skin: Negative for rash.  Neurological: Negative for dizziness, facial asymmetry, speech difficulty, weakness, light-headedness, numbness and headaches.  Psychiatric/Behavioral: Negative for agitation and confusion. The patient is not nervous/anxious.     Social History   Tobacco Use  . Smoking status: Never Smoker  . Smokeless tobacco: Never Used  Substance Use Topics  . Alcohol use: Yes    Comment: occas      Objective:   There were no vitals taken for this visit. There were no vitals filed for this visit.There is no height or weight on file to calculate BMI.   Physical Exam   Patient is alert and oriented and responsive to questions Engages in conversation with provider. Speaks in full sentences without any pauses without any shortness of breath or distress.  Audible cough and congestion noted while on phone. No wheezing or stridor heard. No distress heard.      No results found for any visits on 08/21/19.     Assessment & Plan     Suspected COVID-19 virus infection  Acute non-recurrent maxillary sinusitis  Cough  Chest congestion  Meds ordered this encounter  Medications  . benzonatate (TESSALON) 100 MG capsule    Sig: Take 1 capsule (100 mg total) by mouth 3 (three) times daily. May cause drowsiness    Dispense:  21 capsule    Refill:  0  . amoxicillin-clavulanate (AUGMENTIN) 875-125 MG tablet    Sig: Take 1 tablet by mouth 2 (two) times daily.    Dispense:  20 tablet    Refill:  0  . predniSONE (STERAPRED UNI-PAK 21 TAB) 10 MG  (21) TBPK tablet    Sig: PO: Take 6 tablets on day 1:Take 5 tablets day 2:Take 4 tablets day 3: Take 3 tablets day 4:Take 2 tablets day five: 5 Take 1 tablet day 6    Dispense:  21 tablet    Refill:  0   Be seen in person at urgent care/ ED for in person evaluation if symptoms worsening or persisting.  Covid testing information to schedule and  advised to go for testing on 08/22/19. Advised quarantine per [employer] guidelines. She verbalized understanding and declines work note.  Advised patient call the office or your primary care doctor for an appointment if no improvement within 72 hours or if any symptoms change or worsen at any time  Advised ER or urgent Care if after hours or on weekend. Call 911 for emergency symptoms at any time.Patinet verbalized understanding of all instructions given/reviewed and treatment plan and has no further questions or concerns at this time.       Advised patient call the office or your primary care doctor for an appointment if no improvement within 72 hours or if any symptoms change or worsen at any time  Advised ER or  urgent Care if after hours or on weekend. Call 911 for emergency symptoms at any time.Patinet verbalized understanding of all instructions given/reviewed and treatment plan and has no further questions or concerns at this time.    The entirety of the information documented in the History of Present Illness, Review of Systems and Physical Exam were personally obtained by me. Portions of this information were initially documented by the  Certified Medical Assistant whose name is documented in Epic and reviewed by me for thoroughness and accuracy.  I have personally performed the exam and reviewed the chart and it is accurate to the best of my knowledge.  Museum/gallery conservatorDragon technology has been used and any errors in dictation or transcription are unintentional.  Eula FriedMichelle S. Flinchum FNP-C  [facility]   Jairo BenMichelle Smith  Flinchum, FNP  [facility]

## 2019-08-21 NOTE — Patient Instructions (Addendum)
In person evaluation if worsens at emergency room or urgent care facility of choice. Call 911 if needed for emergency symptoms.  Benzonatate capsules What is this medicine? BENZONATATE (ben ZOE na tate) is used to treat cough. This medicine may be used for other purposes; ask your health care provider or pharmacist if you have questions. COMMON BRAND NAME(S): Tessalon Perles, Zonatuss What should I tell my health care provider before I take this medicine? They need to know if you have any of these conditions:  kidney or liver disease  an unusual or allergic reaction to benzonatate, anesthetics, other medicines, foods, dyes, or preservatives  pregnant or trying to get pregnant  breast-feeding How should I use this medicine? Take this medicine by mouth with a glass of water. Follow the directions on the prescription label. Avoid breaking, chewing, or sucking the capsule, as this can cause serious side effects. Take your medicine at regular intervals. Do not take your medicine more often than directed. Talk to your pediatrician regarding the use of this medicine in children. While this drug may be prescribed for children as young as 16 years old for selected conditions, precautions do apply. Overdosage: If you think you have taken too much of this medicine contact a poison control center or emergency room at once. NOTE: This medicine is only for you. Do not share this medicine with others. What if I miss a dose? If you miss a dose, take it as soon as you can. If it is almost time for your next dose, take only that dose. Do not take double or extra doses. What may interact with this medicine? Do not take this medicine with any of the following medications:  MAOIs like Carbex, Eldepryl, Marplan, Nardil, and Parnate This list may not describe all possible interactions. Give your health care provider a list of all the medicines, herbs, non-prescription drugs, or dietary supplements you use. Also  tell them if you smoke, drink alcohol, or use illegal drugs. Some items may interact with your medicine. What should I watch for while using this medicine? Tell your doctor if your symptoms do not improve or if they get worse. If you have a high fever, skin rash, or headache, see your health care professional. You may get drowsy or dizzy. Do not drive, use machinery, or do anything that needs mental alertness until you know how this medicine affects you. Do not sit or stand up quickly, especially if you are an older patient. This reduces the risk of dizzy or fainting spells. What side effects may I notice from receiving this medicine? Side effects that you should report to your doctor or health care professional as soon as possible:  allergic reactions like skin rash, itching or hives, swelling of the face, lips, or tongue  breathing problems  chest pain  confusion or hallucinations  irregular heartbeat  numbness of mouth or throat  seizures Side effects that usually do not require medical attention (report to your doctor or health care professional if they continue or are bothersome):  burning feeling in the eyes  constipation  headache  nasal congestion  stomach upset This list may not describe all possible side effects. Call your doctor for medical advice about side effects. You may report side effects to FDA at 1-800-FDA-1088. Where should I keep my medicine? Keep out of the reach of children. Store at room temperature between 15 and 30 degrees C (59 and 86 degrees F). Keep tightly closed. Protect from light and moisture.  Throw away any unused medicine after the expiration date. NOTE: This sheet is a summary. It may not cover all possible information. If you have questions about this medicine, talk to your doctor, pharmacist, or health care provider.  2020 Elsevier/Gold Standard (2007-11-20 14:52:56) Prednisolone tablets What is this medicine? PREDNISOLONE (pred NISS oh  lone) is a corticosteroid. It is commonly used to treat inflammation of the skin, joints, lungs, and other organs. Common conditions treated include asthma, allergies, and arthritis. It is also used for other conditions, such as blood disorders and diseases of the adrenal glands. This medicine may be used for other purposes; ask your health care provider or pharmacist if you have questions. COMMON BRAND NAME(S): Millipred, Millipred DP, Millipred DP 12-Day, Millipred DP 6 Day, Prednoral What should I tell my health care provider before I take this medicine? They need to know if you have any of these conditions:  Cushing's syndrome  diabetes  glaucoma  heart problems or disease  high blood pressure  infection such as herpes, measles, tuberculosis, or chickenpox  kidney disease  liver disease  mental problems  myasthenia gravis  osteoporosis  seizures  stomach ulcer or intestine disease including colitis and diverticulitis  thyroid problem  an unusual or allergic reaction to lactose, prednisolone, other medicines, foods, dyes, or preservatives  pregnant or trying to get pregnant  breast-feeding How should I use this medicine? Take this medicine by mouth with a glass of water. Follow the directions on the prescription label. Take it with food or milk to avoid stomach upset. If you are taking this medicine once a day, take it in the morning. Do not take more medicine than you are told to take. Do not suddenly stop taking your medicine because you may develop a severe reaction. Your doctor will tell you how much medicine to take. If your doctor wants you to stop the medicine, the dose may be slowly lowered over time to avoid any side effects. Talk to your pediatrician regarding the use of this medicine in children. Special care may be needed. Overdosage: If you think you have taken too much of this medicine contact a poison control center or emergency room at once. NOTE: This  medicine is only for you. Do not share this medicine with others. What if I miss a dose? If you miss a dose, take it as soon as you can. If it is almost time for your next dose, take only that dose. Do not take double or extra doses. What may interact with this medicine? Do not take this medicine with any of the following medications:  metyrapone  mifepristone This medicine may also interact with the following medications:  aminoglutethimide  amphotericin B  aspirin and aspirin-like medicines  barbiturates  certain medicines for diabetes, like glipizide or glyburide  cholestyramine  cholinesterase inhibitors  cyclosporine  digoxin  diuretics  ephedrine  female hormones, like estrogens and birth control pills  isoniazid  ketoconazole  NSAIDS, medicines for pain and inflammation, like ibuprofen or naproxen  phenytoin  rifampin  toxoids  vaccines  warfarin This list may not describe all possible interactions. Give your health care provider a list of all the medicines, herbs, non-prescription drugs, or dietary supplements you use. Also tell them if you smoke, drink alcohol, or use illegal drugs. Some items may interact with your medicine. What should I watch for while using this medicine? Visit your doctor or health care professional for regular checks on your progress. If you are taking  this medicine over a prolonged period, carry an identification card with your name and address, the type and dose of your medicine, and your doctor's name and address. This medicine may increase your risk of getting an infection. Tell your doctor or health care professional if you are around anyone with measles or chickenpox, or if you develop sores or blisters that do not heal properly. If you are going to have surgery, tell your doctor or health care professional that you have taken this medicine within the last twelve months. Ask your doctor or health care professional about your  diet. You may need to lower the amount of salt you eat. This medicine may increase blood sugar. Ask your healthcare provider if changes in diet or medicines are needed if you have diabetes. What side effects may I notice from receiving this medicine? Side effects that you should report to your doctor or health care professional as soon as possible:  allergic reactions like skin rash, itching or hives, swelling of the face, lips, or tongue  changes in emotions or moods  eye pain   signs and symptoms of high blood sugar such as being more thirsty or hungry or having to urinate more than normal. You may also feel very tired or have blurry vision.  signs and symptoms of infection like fever or chills; cough; sore throat; pain or trouble passing urine  slow growth in children (if used for longer periods of time)  swelling of ankles, feet  trouble sleeping  weak bones (if used for longer periods of time) Side effects that usually do not require medical attention (report to your doctor or health care professional if they continue or are bothersome):  nausea  skin problems, acne, thin and shiny skin  upset stomach  weight gain This list may not describe all possible side effects. Call your doctor for medical advice about side effects. You may report side effects to FDA at 1-800-FDA-1088. Where should I keep my medicine? Keep out of the reach of children. Store at room temperature between 15 and 30 degrees C (59 and 86 degrees F). Keep container tightly closed. Throw away any unused medicine after the expiration date. NOTE: This sheet is a summary. It may not cover all possible information. If you have questions about this medicine, talk to your doctor, pharmacist, or health care provider.  2020 Elsevier/Gold Standard (2018-05-23 10:30:56) Cough, Adult A cough helps to clear your throat and lungs. A cough may be a sign of an illness or another medical condition. An acute cough may  only last 2-3 weeks, while a chronic cough may last 8 or more weeks. Many things can cause a cough. They include:  Germs (viruses or bacteria) that attack the airway.  Breathing in things that bother (irritate) your lungs.  Allergies.  Asthma.  Mucus that runs down the back of your throat (postnasal drip).  Smoking.  Acid backing up from the stomach into the tube that moves food from the mouth to the stomach (gastroesophageal reflux).  Some medicines.  Lung problems.  Other medical conditions, such as heart failure or a blood clot in the lung (pulmonary embolism). Follow these instructions at home: Medicines  Take over-the-counter and prescription medicines only as told by your doctor.  Talk with your doctor before you take medicines that stop a cough (coughsuppressants). Lifestyle   Do not smoke, and try not to be around smoke. Do not use any products that contain nicotine or tobacco, such as cigarettes, e-cigarettes,  and chewing tobacco. If you need help quitting, ask your doctor.  Drink enough fluid to keep your pee (urine) pale yellow.  Avoid caffeine.  Do not drink alcohol if your doctor tells you not to drink. General instructions   Watch for any changes in your cough. Tell your doctor about them.  Always cover your mouth when you cough.  Stay away from things that make you cough, such as perfume, candles, campfire smoke, or cleaning products.  If the air is dry, use a cool mist vaporizer or humidifier in your home.  If your cough is worse at night, try using extra pillows to raise your head up higher while you sleep.  Rest as needed.  Keep all follow-up visits as told by your doctor. This is important. Contact a doctor if:  You have new symptoms.  You cough up pus.  Your cough does not get better after 2-3 weeks, or your cough gets worse.  Cough medicine does not help your cough and you are not sleeping well.  You have pain that gets worse or  pain that is not helped with medicine.  You have a fever.  You are losing weight and you do not know why.  You have night sweats. Get help right away if:  You cough up blood.  You have trouble breathing.  Your heartbeat is very fast. These symptoms may be an emergency. Do not wait to see if the symptoms will go away. Get medical help right away. Call your local emergency services (911 in the [place] Do not drive yourself to the hospital. Summary  A cough helps to clear your throat and lungs. Many things can cause a cough.  Take over-the-counter and prescription medicines only as told by your doctor.  Always cover your mouth when you cough.  Contact a doctor if you have new symptoms or you have a cough that does not get better or gets worse. This information is not intended to replace advice given to you by your health care provider. Make sure you discuss any questions you have with your health care provider. Document Released: 05/04/2011 Document Revised: 09/09/2018 Document Reviewed: 09/09/2018 Elsevier Patient Education  2020 [employer]. COVID-19 COVID-19 is a respiratory infection that is caused by a virus called severe acute respiratory syndrome coronavirus 2 (SARS-CoV-2). The disease is also known as coronavirus disease or novel coronavirus. In some people, the virus may not cause any symptoms. In others, it may cause a serious infection. The infection can get worse quickly and can lead to complications, such as:  Pneumonia, or infection of the lungs.  Acute respiratory distress syndrome or ARDS. This is fluid build-up in the lungs.  Acute respiratory failure. This is a condition in which there is not enough oxygen passing from the lungs to the body.  Sepsis or septic shock. This is a serious bodily reaction to an infection.  Blood clotting problems.  Secondary infections due to bacteria or fungus. The virus that causes COVID-19 is contagious. This means that it can  spread from person to person through droplets from coughs and sneezes (respiratory secretions). What are the causes? This illness is caused by a virus. You may catch the virus by:  Breathing in droplets from an infected person's cough or sneeze.  Touching something, like a table or a doorknob, that was exposed to the virus (contaminated) and then touching your mouth, nose, or eyes. What increases the risk? Risk for infection You are more likely to be infected  with this virus if you:  Live in or travel to an area with a COVID-19 outbreak.  Come in contact with a sick person who recently traveled to an area with a COVID-19 outbreak.  Provide care for or live with a person who is infected with COVID-19. Risk for serious illness You are more likely to become seriously ill from the virus if you:  Are 23 years of age or older.  Have a long-term disease that lowers your body's ability to fight infection (immunocompromised).  Live in a nursing home or long-term care facility.  Have a long-term (chronic) disease such as: ? Chronic lung disease, including chronic obstructive pulmonary disease or asthma ? Heart disease. ? Diabetes. ? Chronic kidney disease. ? Liver disease.  Are obese. What are the signs or symptoms? Symptoms of this condition can range from mild to severe. Symptoms may appear any time from 2 to 14 days after being exposed to the virus. They include:  A fever.  A cough.  Difficulty breathing.  Chills.  Muscle pains.  A sore throat.  Loss of taste or smell. Some people may also have stomach problems, such as nausea, vomiting, or diarrhea. Other people may not have any symptoms of COVID-19. How is this diagnosed? This condition may be diagnosed based on:  Your signs and symptoms, especially if: ? You live in an area with a COVID-19 outbreak. ? You recently traveled to or from an area where the virus is common. ? You provide care for or live with a person  who was diagnosed with COVID-19.  A physical exam.  Lab tests, which may include: ? A nasal swab to take a sample of fluid from your nose. ? A throat swab to take a sample of fluid from your throat. ? A sample of mucus from your lungs (sputum). ? Blood tests.  Imaging tests, which may include, X-rays, CT scan, or ultrasound. How is this treated? At present, there is no medicine to treat COVID-19. Medicines that treat other diseases are being used on a trial basis to see if they are effective against COVID-19. Your health care provider will talk with you about ways to treat your symptoms. For most people, the infection is mild and can be managed at home with rest, fluids, and over-the-counter medicines. Treatment for a serious infection usually takes places in a hospital intensive care unit (ICU). It may include one or more of the following treatments. These treatments are given until your symptoms improve.  Receiving fluids and medicines through an IV.  Supplemental oxygen. Extra oxygen is given through a tube in the nose, a face mask, or a hood.  Positioning you to lie on your stomach (prone position). This makes it easier for oxygen to get into the lungs.  Continuous positive airway pressure (CPAP) or bi-level positive airway pressure (BPAP) machine. This treatment uses mild air pressure to keep the airways open. A tube that is connected to a motor delivers oxygen to the body.  Ventilator. This treatment moves air into and out of the lungs by using a tube that is placed in your windpipe.  Tracheostomy. This is a procedure to create a hole in the neck so that a breathing tube can be inserted.  Extracorporeal membrane oxygenation (ECMO). This procedure gives the lungs a chance to recover by taking over the functions of the heart and lungs. It supplies oxygen to the body and removes carbon dioxide. Follow these instructions at home: Lifestyle  If you are  sick, stay home except to get  medical care. Your health care provider will tell you how long to stay home. Call your health care provider before you go for medical care.  Rest at home as told by your health care provider.  Do not use any products that contain nicotine or tobacco, such as cigarettes, e-cigarettes, and chewing tobacco. If you need help quitting, ask your health care provider.  Return to your normal activities as told by your health care provider. Ask your health care provider what activities are safe for you. General instructions  Take over-the-counter and prescription medicines only as told by your health care provider.  Drink enough fluid to keep your urine pale yellow.  Keep all follow-up visits as told by your health care provider. This is important. How is this prevented?  There is no vaccine to help prevent COVID-19 infection. However, there are steps you can take to protect yourself and others from this virus. To protect yourself:   Do not travel to areas where COVID-19 is a risk. The areas where COVID-19 is reported change often. To identify high-risk areas and travel restrictions, check the CDC travel website: StageSync.si  If you live in, or must travel to, an area where COVID-19 is a risk, take precautions to avoid infection. ? Stay away from people who are sick. ? Wash your hands often with soap and water for 20 seconds. If soap and water are not available, use an alcohol-based hand sanitizer. ? Avoid touching your mouth, face, eyes, or nose. ? Avoid going out in public, follow guidance from your state and local health authorities. ? If you must go out in public, wear a cloth face covering or face mask. ? Disinfect objects and surfaces that are frequently touched every day. This may include:  Counters and tables.  Doorknobs and light switches.  Sinks and faucets.  Electronics, such as phones, remote controls, keyboards, computers, and tablets. To protect others: If  you have symptoms of COVID-19, take steps to prevent the virus from spreading to others.  If you think you have a COVID-19 infection, contact your health care provider right away. Tell your health care team that you think you may have a COVID-19 infection.  Stay home. Leave your house only to seek medical care. Do not use public transport.  Do not travel while you are sick.  Wash your hands often with soap and water for 20 seconds. If soap and water are not available, use alcohol-based hand sanitizer.  Stay away from other members of your household. Let healthy household members care for children and pets, if possible. If you have to care for children or pets, wash your hands often and wear a mask. If possible, stay in your own room, separate from others. Use a different bathroom.  Make sure that all people in your household wash their hands well and often.  Cough or sneeze into a tissue or your sleeve or elbow. Do not cough or sneeze into your hand or into the air.  Wear a cloth face covering or face mask. Where to find more information  Centers for Disease Control and Prevention: StickerEmporium.tn  World Health Organization: https://thompson-craig.com/ Contact a health care provider if:  You live in or have traveled to an area where COVID-19 is a risk and you have symptoms of the infection.  You have had contact with someone who has COVID-19 and you have symptoms of the infection. Get help right away if:  You have trouble  breathing.  You have pain or pressure in your chest.  You have confusion.  You have bluish lips and fingernails.  You have difficulty waking from sleep.  You have symptoms that get worse. These symptoms may represent a serious problem that is an emergency. Do not wait to see if the symptoms will go away. Get medical help right away. Call your local emergency services (911 in the [place] Do not drive yourself to the  hospital. Let the emergency medical personnel know if you think you have COVID-19. Summary  COVID-19 is a respiratory infection that is caused by a virus. It is also known as coronavirus disease or novel coronavirus. It can cause serious infections, such as pneumonia, acute respiratory distress syndrome, acute respiratory failure, or sepsis.  The virus that causes COVID-19 is contagious. This means that it can spread from person to person through droplets from coughs and sneezes.  You are more likely to develop a serious illness if you are 66 years of age or older, have a weak immunity, live in a nursing home, or have chronic disease.  There is no medicine to treat COVID-19. Your health care provider will talk with you about ways to treat your symptoms.  Take steps to protect yourself and others from infection. Wash your hands often and disinfect objects and surfaces that are frequently touched every day. Stay away from people who are sick and wear a mask if you are sick. This information is not intended to replace advice given to you by your health care provider. Make sure you discuss any questions you have with your health care provider. Document Released: 09/26/2018 Document Revised: 01/16/2019 Document Reviewed: 09/26/2018 Elsevier Patient Education  2020 Else[employer] Inc. Sinusitis, Adult Sinusitis is soreness and swelling (inflammation) of your sinuses. Sinuses are hollow spaces in the bones around your face. They are located:  Around your eyes.  In the middle of your forehead.  Behind your nose.  In your cheekbones. Your sinuses and nasal passages are lined with a fluid called mucus. Mucus drains out of your sinuses. Swelling can trap mucus in your sinuses. This lets germs (bacteria, virus, or fungus) grow, which leads to infection. Most of the time, this condition is caused by a virus. What are the causes? This condition is caused by:  Allergies.  Asthma.  Germs.  Things that  block your nose or sinuses.  Growths in the nose (nasal polyps).  Chemicals or irritants in the air.  Fungus (rare). What increases the risk? You are more likely to develop this condition if:  You have a weak body defense system (immune system).  You do a lot of swimming or diving.  You use nasal sprays too much.  You smoke. What are the signs or symptoms? The main symptoms of this condition are pain and a feeling of pressure around the sinuses. Other symptoms include:  Stuffy nose (congestion).  Runny nose (drainage).  Swelling and warmth in the sinuses.  Headache.  Toothache.  A cough that may get worse at night.  Mucus that collects in the throat or the back of the nose (postnasal drip).  Being unable to smell and taste.  Being very tired (fatigue).  A fever.  Sore throat.  Bad breath. How is this diagnosed? This condition is diagnosed based on:  Your symptoms.  Your medical history.  A physical exam.  Tests to find out if your condition is short-term (acute) or long-term (chronic). Your doctor may: ? Check your nose for  growths (polyps). ? Check your sinuses using a tool that has a light (endoscope). ? Check for allergies or germs. ? Do imaging tests, such as an MRI or CT scan. How is this treated? Treatment for this condition depends on the cause and whether it is short-term or long-term.  If caused by a virus, your symptoms should go away on their own within 10 days. You may be given medicines to relieve symptoms. They include: ? Medicines that shrink swollen tissue in the nose. ? Medicines that treat allergies (antihistamines). ? A spray that treats swelling of the nostrils. ? Rinses that help get rid of thick mucus in your nose (nasal saline washes).  If caused by bacteria, your doctor may wait to see if you will get better without treatment. You may be given antibiotic medicine if you have: ? A very bad infection. ? A weak body defense  system.  If caused by growths in the nose, you may need to have surgery. Follow these instructions at home: Medicines  Take, use, or apply over-the-counter and prescription medicines only as told by your doctor. These may include nasal sprays.  If you were prescribed an antibiotic medicine, take it as told by your doctor. Do not stop taking the antibiotic even if you start to feel better. Hydrate and humidify   Drink enough water to keep your pee (urine) pale yellow.  Use a cool mist humidifier to keep the humidity level in your home above 50%.  Breathe in steam for 10-15 minutes, 3-4 times a day, or as told by your doctor. You can do this in the bathroom while a hot shower is running.  Try not to spend time in cool or dry air. Rest  Rest as much as you can.  Sleep with your head raised (elevated).  Make sure you get enough sleep each night. General instructions   Put a warm, moist washcloth on your face 3-4 times a day, or as often as told by your doctor. This will help with discomfort.  Wash your hands often with soap and water. If there is no soap and water, use hand sanitizer.  Do not smoke. Avoid being around people who are smoking (secondhand smoke).  Keep all follow-up visits as told by your doctor. This is important. Contact a doctor if:  You have a fever.  Your symptoms get worse.  Your symptoms do not get better within 10 days. Get help right away if:  You have a very bad headache.  You cannot stop throwing up (vomiting).  You have very bad pain or swelling around your face or eyes.  You have trouble seeing.  You feel confused.  Your neck is stiff.  You have trouble breathing. Summary  Sinusitis is swelling of your sinuses. Sinuses are hollow spaces in the bones around your face.  This condition is caused by tissues in your nose that become inflamed or swollen. This traps germs. These can lead to infection.  If you were prescribed an  antibiotic medicine, take it as told by your doctor. Do not stop taking it even if you start to feel better.  Keep all follow-up visits as told by your doctor. This is important. This information is not intended to replace advice given to you by your health care provider. Make sure you discuss any questions you have with your health care provider. Document Released: 02/07/2008 Document Revised: 01/21/2018 Document Reviewed: 01/21/2018 Elsevier Patient Education  2020 Elsevier Inc. COVID-19: How to Protect Yourself  and Others Know how it spreads  There is currently no vaccine to prevent coronavirus disease 2019 (COVID-19).  The best way to prevent illness is to avoid being exposed to this virus.  The virus is thought to spread mainly from person-to-person. ? Between people who are in close contact with one another (within about 6 feet). ? Through respiratory droplets produced when an infected person coughs, sneezes or talks. ? These droplets can land in the mouths or noses of people who are nearby or possibly be inhaled into the lungs. ? Some recent studies have suggested that COVID-19 may be spread by people who are not showing symptoms. Everyone should Clean your hands often  Wash your hands often with soap and water for at least 20 seconds especially after you have been in a public place, or after blowing your nose, coughing, or sneezing.  If soap and water are not readily available, use a hand sanitizer that contains at least 60% alcohol. Cover all surfaces of your hands and rub them together until they feel dry.  Avoid touching your eyes, nose, and mouth with unwashed hands. Avoid close contact  Stay home if you are sick.  Avoid close contact with people who are sick.  Put distance between yourself and other people. ? Remember that some people without symptoms may be able to spread virus. ? This is especially important for people who are at higher risk of getting very  [employer] Cover your mouth and nose with a cloth face cover when around others  You could spread COVID-19 to others even if you do not feel sick.  Everyone should wear a cloth face cover when they have to go out in public, for example to the grocery store or to pick up other necessities. ? Cloth face coverings should not be placed on young children under age 61, anyone who has trouble breathing, or is unconscious, incapacitated or otherwise unable to remove the mask without assistance.  The cloth face cover is meant to protect other people in case you are infected.  Do NOT use a facemask meant for a Research scientist (physical sciences).  Continue to keep about 6 feet between yourself and others. The cloth face cover is not a substitute for social distancing. Cover coughs and sneezes  If you are in a private setting and do not have on your cloth face covering, remember to always cover your mouth and nose with a tissue when you cough or sneeze or use the inside of your elbow.  Throw used tissues in the trash.  Immediately wash your hands with soap and water for at least 20 seconds. If soap and water are not readily available, clean your hands with a hand sanitizer that contains at least 60% alcohol. Clean and disinfect  Clean AND disinfect frequently touched surfaces daily. This includes tables, doorknobs, light switches, countertops, handles, desks, phones, keyboards, toilets, faucets, and sinks. [employer]timeonline.com  If surfaces are dirty, clean them: Use detergent or soap and water prior to disinfection.  Then, use a household disinfectant. You can see a list of EPA-registered household disinfectants here. [employer] 01/07/2019 This information is not intended to replace advice given to you by your health care provider. Make sure you discuss any questions you  have with your health care provider. Document Released: 12/17/2018 Document Revised: 01/15/2019 Document Reviewed: 12/17/2018 Elsevier Patient Education  2020 [employer].

## 2019-09-16 ENCOUNTER — Other Ambulatory Visit: Payer: Self-pay | Admitting: Family Medicine

## 2019-10-18 ENCOUNTER — Other Ambulatory Visit: Payer: Self-pay | Admitting: Family Medicine

## 2019-10-18 ENCOUNTER — Other Ambulatory Visit: Payer: Self-pay | Admitting: Physician Assistant

## 2019-10-18 DIAGNOSIS — F329 Major depressive disorder, single episode, unspecified: Secondary | ICD-10-CM

## 2019-10-18 DIAGNOSIS — G43809 Other migraine, not intractable, without status migrainosus: Secondary | ICD-10-CM

## 2019-10-18 DIAGNOSIS — F32A Depression, unspecified: Secondary | ICD-10-CM

## 2019-10-18 NOTE — Telephone Encounter (Signed)
Requested medication (s) are due for refill today: yes  Requested medication (s) are on the active medication list: yes  Last refill:  07/24/19  Future visit scheduled: no  Notes to clinic:  note on previous refill stated that OV and fasting labs before another refill please review   Requested Prescriptions  Pending Prescriptions Disp Refills   amitriptyline (ELAVIL) 50 MG tablet [Pharmacy Med Name: AMITRIPTYLINE HCL 50 MG TAB] 30 tablet 1    Sig: TAKE ONE TABLET BY MOUTH AT BEDTIME      Psychiatry:  Antidepressants - Heterocyclics (TCAs) Failed - 10/18/2019  8:10 AM      Failed - Completed PHQ-2 or PHQ-9 in the last 360 days.      Passed - Valid encounter within last 6 months    Recent Outpatient Visits           1 month ago Suspected COVID-19 virus infection   [facility] [place], Eula Fried, FNP   2 months ago Epidermal cyst   [facility] [place], Jodell [place], [place]   1 year ago Variants of migraine   [facility], Jodell Cipro, [place]   1 year ago Dysuria   [facility] [place], [place], [place]   1 year ago Cystitis with hematuria   [facility] [place], [place]       Future Appointments             In 4 months Doreene Burke, CNM [facility]

## 2019-10-18 NOTE — Telephone Encounter (Signed)
Requested medication (s) are due for refill today: yes  Requested medication (s) are on the active medication list: yes  Last refill:  09/17/19  Future visit scheduled: no  Notes to clinic:  medication not delegated to NT to refill   Requested Prescriptions  Pending Prescriptions Disp Refills   topiramate (TOPAMAX) 50 MG tablet [Pharmacy Med Name: TOPIRAMATE 50 MG TAB] 30 tablet 1    Sig: TAKE ONE TABLET BY MOUTH EVERY DAY      Not Delegated - Neurology: Anticonvulsants - topiramate & zonisamide Failed - 10/18/2019  8:12 AM      Failed - This refill cannot be delegated      Failed - Cr in normal range and within 360 days    Creatinine  Date Value Ref Range Status  05/27/2013 0.96 0.60 - 1.30 mg/dL Final   Creatinine, Ser  Date Value Ref Range Status  03/14/2018 0.84 0.57 - 1.00 mg/dL Final          Failed - CO2 in normal range and within 360 days    CO2  Date Value Ref Range Status  03/14/2018 20 20 - 29 mmol/L Final   Co2  Date Value Ref Range Status  05/27/2013 26 (H) 16 - 25 mmol/L Final          Passed - Valid encounter within last 12 months    Recent Outpatient Visits           1 month ago Suspected COVID-19 virus infection   [facility] Flinchum, Eula Fried, FNP   2 months ago Epidermal cyst   [facility], Jodell Cipro, [place]   1 year ago Variants of migraine   [facility], Jodell Cipro, [place]   1 year ago Dysuria   [facility] [place], [place], [place]   1 year ago Cystitis with hematuria   [facility] [place], Molly Maduro, [place]       Future Appointments             In 4 months Doreene Burke, CNM [facility]

## 2019-11-24 ENCOUNTER — Other Ambulatory Visit: Payer: Self-pay | Admitting: Family Medicine

## 2019-11-24 DIAGNOSIS — F32A Depression, unspecified: Secondary | ICD-10-CM

## 2019-11-24 DIAGNOSIS — F329 Major depressive disorder, single episode, unspecified: Secondary | ICD-10-CM

## 2019-11-24 DIAGNOSIS — G43809 Other migraine, not intractable, without status migrainosus: Secondary | ICD-10-CM

## 2019-12-25 ENCOUNTER — Other Ambulatory Visit: Payer: Self-pay | Admitting: Family Medicine

## 2019-12-25 DIAGNOSIS — F32A Depression, unspecified: Secondary | ICD-10-CM

## 2019-12-25 DIAGNOSIS — G43809 Other migraine, not intractable, without status migrainosus: Secondary | ICD-10-CM

## 2019-12-25 DIAGNOSIS — F329 Major depressive disorder, single episode, unspecified: Secondary | ICD-10-CM

## 2020-01-23 ENCOUNTER — Other Ambulatory Visit: Payer: Self-pay | Admitting: Family Medicine

## 2020-01-23 ENCOUNTER — Ambulatory Visit: Payer: Self-pay

## 2020-01-23 ENCOUNTER — Other Ambulatory Visit: Payer: Self-pay | Admitting: Radiology

## 2020-01-23 ENCOUNTER — Other Ambulatory Visit: Payer: Self-pay

## 2020-01-23 DIAGNOSIS — Z0000 Encounter for general adult medical examination without abnormal findings: Secondary | ICD-10-CM

## 2020-02-25 ENCOUNTER — Encounter: Payer: Medicaid Other | Admitting: Certified Nurse Midwife

## 2020-03-01 ENCOUNTER — Other Ambulatory Visit: Payer: Self-pay | Admitting: Family Medicine

## 2020-03-01 DIAGNOSIS — F32A Depression, unspecified: Secondary | ICD-10-CM

## 2020-03-01 DIAGNOSIS — G43809 Other migraine, not intractable, without status migrainosus: Secondary | ICD-10-CM

## 2020-03-01 NOTE — Telephone Encounter (Signed)
Requested medication (s) are due for refill today: yes  Requested medication (s) are on the active medication list: yes  Last refill:  01/26/2020  Future visit scheduled:no  Notes to clinic:  review for refill Patient is overdue for follow up   Requested Prescriptions  Pending Prescriptions Disp Refills   amitriptyline (ELAVIL) 50 MG tablet [Pharmacy Med Name: AMITRIPTYLINE HCL 50 MG TAB] 30 tablet 1    Sig: TAKE ONE TABLET BY MOUTH AT BEDTIME      Psychiatry:  Antidepressants - Heterocyclics (TCAs) Failed - 03/01/2020  1:03 PM      Failed - Completed PHQ-2 or PHQ-9 in the last 360 days.      Failed - Valid encounter within last 6 months    Recent Outpatient Visits           6 months ago Suspected COVID-19 virus infection   [facility] Flinchum, Eula Fried, FNP   7 months ago Epidermal cyst   [facility], Jodell Cipro, [place]   1 year ago Variants of migraine   [facility], Jodell Cipro, [place]   1 year ago Dysuria   [facility] [place], [place], [place]   1 year ago Cystitis with hematuria   [facility] [place], Molly Maduro, [place]       Future Appointments             In 1 month Doreene Burke, CNM [facility]              topiramate (TOPAMAX) 50 MG tablet [Pharmacy Med Name: TOPIRAMATE 50 MG TAB] 30 tablet 1    Sig: TAKE ONE TABLET BY MOUTH EVERY DAY      Not Delegated - Neurology: Anticonvulsants - topiramate & zonisamide Failed - 03/01/2020  1:03 PM      Failed - This refill cannot be delegated      Failed - Cr in normal range and within 360 days    Creatinine  Date Value Ref Range Status  05/27/2013 0.96 0.60 - 1.30 mg/dL Final   Creatinine, Ser  Date Value Ref Range Status  03/14/2018 0.84 0.57 - 1.00 mg/dL Final          Failed - CO2 in normal range and within 360 days    CO2  Date Value Ref Range Status  03/14/2018 20 20 - 29 mmol/L Final   Co2  Date Value Ref Range  Status  05/27/2013 26 (H) 16 - 25 mmol/L Final          Passed - Valid encounter within last 12 months    Recent Outpatient Visits           6 months ago Suspected COVID-19 virus infection   [facility] Flinchum, Eula Fried, FNP   7 months ago Epidermal cyst   [facility], Jodell Cipro, [place]   1 year ago Variants of migraine   [facility], Jodell Cipro, [place]   1 year ago Dysuria   [facility] [place], [place], [place]   1 year ago Cystitis with hematuria   [facility] [place], Molly [place], [place]       Future Appointments             In 1 month Doreene Burke, CNM [facility]

## 2020-04-01 ENCOUNTER — Telehealth (INDEPENDENT_AMBULATORY_CARE_PROVIDER_SITE_OTHER): Payer: Managed Care, Other (non HMO) | Admitting: Physician Assistant

## 2020-04-01 DIAGNOSIS — G43809 Other migraine, not intractable, without status migrainosus: Secondary | ICD-10-CM | POA: Diagnosis not present

## 2020-04-01 DIAGNOSIS — R059 Cough, unspecified: Secondary | ICD-10-CM

## 2020-04-01 DIAGNOSIS — R05 Cough: Secondary | ICD-10-CM

## 2020-04-01 DIAGNOSIS — F329 Major depressive disorder, single episode, unspecified: Secondary | ICD-10-CM | POA: Diagnosis not present

## 2020-04-01 DIAGNOSIS — F32A Depression, unspecified: Secondary | ICD-10-CM

## 2020-04-01 MED ORDER — TOPIRAMATE 50 MG PO TABS: 50.0000 mg | ORAL_TABLET | Freq: Every day | ORAL | 2 refills | Status: DC

## 2020-04-01 MED ORDER — AMITRIPTYLINE HCL 50 MG PO TABS: 50.0000 mg | ORAL_TABLET | Freq: Every day | ORAL | 2 refills | Status: DC

## 2020-04-01 NOTE — Progress Notes (Signed)
MyChart Video Visit    Virtual Visit via Video Note   This visit type was conducted due to national recommendations for restrictions regarding the COVID-19 Pandemic (e.g. social distancing) in an effort to limit this patient's exposure and mitigate transmission in our community. This patient is at least at moderate risk for complications without adequate follow up. This format is felt to be most appropriate for this patient at this time. Physical exam was limited by quality of the video and audio technology used for the visit.   Patient location: Home Provider location: Office    I discussed the limitations of evaluation and management by telemedicine and the availability of in person appointments. The patient expressed understanding and agreed to proceed.   Patient: Emily Simon   DOB: 1996/06/25   24 y.o. Female  MRN: [id number] Visit Date: 04/01/2020  Today's healthcare provider: Trey Sailors, PA-C   No chief complaint on file.  Subjective    HPI   Patient reports that over one week ago she had sore throat and congestion. She reports this has somewhat resolved. No measured temperature, some chills. No nausea, vomiting, diarrhea, or joint pain. Reports sick contacts including her fiance. She is not vaccinated for COVID. Currently right now her worst symptoms are coughing and feels like it is stuck. She took mucinex, Dayquil and Nyquil. She tried claritin for several days. She also used saline nasal spray.   She is also requesting refills on her migraine medications. She is taking topamax and elavil and has been for good success. These reduce her migraine frequency.     Medications: Outpatient Medications Prior to Visit  Medication Sig  . benzonatate (TESSALON) 100 MG capsule Take 1 capsule (100 mg total) by mouth 3 (three) times daily. May cause drowsiness  . ibuprofen (ADVIL,MOTRIN) 600 MG tablet Take by mouth.  . levonorgestrel (MIRENA, 52 MG,) 20 MCG/24HR IUD 1  each by Intrauterine route once.  . predniSONE (STERAPRED UNI-PAK 21 TAB) 10 MG (21) TBPK tablet PO: Take 6 tablets on day 1:Take 5 tablets day 2:Take 4 tablets day 3: Take 3 tablets day 4:Take 2 tablets day five: 5 Take 1 tablet day 6  . [DISCONTINUED] amitriptyline (ELAVIL) 50 MG tablet TAKE ONE TABLET BY MOUTH AT BEDTIME  . [DISCONTINUED] amoxicillin-clavulanate (AUGMENTIN) 875-125 MG tablet Take 1 tablet by mouth 2 (two) times daily.  . [DISCONTINUED] topiramate (TOPAMAX) 50 MG tablet TAKE ONE TABLET BY MOUTH EVERY DAY   No facility-administered medications prior to visit.    Review of Systems  HENT: Positive for congestion, rhinorrhea and sore throat.       Objective    There were no vitals taken for this visit.   Physical Exam Constitutional:      Appearance: Normal appearance.  Pulmonary:     Effort: Pulmonary effort is normal. No respiratory distress.  Neurological:     Mental Status: She is alert and oriented to person, place, and time. Mental status is at baseline.  Psychiatric:        Mood and Affect: Mood normal.        Behavior: Behavior normal.        Assessment & Plan    1. Cough  - symptoms and exam c/w viral URI  - no evidence of strep pharyngitis, CAP, AOM, bacterial sinusitis, or other bacterial infection - concern for possible COVID19 infection - will send for outpatient testing - discussed need to quarantine 10 days from start of symptoms and  until fever-free for at least 48 hours - discussed need to quarantine household members - discussed symptomatic management, natural course, and return precautions -counseled on benefits of getting covid vaccination.   2. Other migraine without status migrainosus, not intractable  - CBC with Differential - TSH - Comprehensive Metabolic Panel (CMET) - topiramate (TOPAMAX) 50 MG tablet; Take 1 tablet (50 mg total) by mouth daily.  Dispense: 30 tablet; Refill: 2  3. Variants of migraine  - amitriptyline  (ELAVIL) 50 MG tablet; Take 1 tablet (50 mg total) by mouth at bedtime.  Dispense: 30 tablet; Refill: 2  4. Depression, unspecified depression type  - amitriptyline (ELAVIL) 50 MG tablet; Take 1 tablet (50 mg total) by mouth at bedtime.  Dispense: 30 tablet; Refill: 2    Return if symptoms worsen or fail to improve.     I discussed the assessment and treatment plan with the patient. The patient was provided an opportunity to ask questions and all were answered. The patient agreed with the plan and demonstrated an understanding of the instructions.   The patient was advised to call back or seek an in-person evaluation if the symptoms worsen or if the condition fails to improve as anticipated.   ITrey Sailors, PA-C, have reviewed all documentation for this visit. The documentation on 04/02/20 for the exam, diagnosis, procedures, and orders are all accurate and complete.   Maryella Shivers [facility] [phone] (phone) [phone] (fax)  [facility]

## 2020-04-20 ENCOUNTER — Other Ambulatory Visit: Payer: Self-pay

## 2020-04-20 ENCOUNTER — Encounter: Payer: Self-pay | Admitting: Certified Nurse Midwife

## 2020-04-20 ENCOUNTER — Ambulatory Visit (INDEPENDENT_AMBULATORY_CARE_PROVIDER_SITE_OTHER): Payer: Managed Care, Other (non HMO) | Admitting: Certified Nurse Midwife

## 2020-04-20 VITALS — BP 100/72 | HR 92 | Ht 66.0 in | Wt 202.3 lb

## 2020-04-20 DIAGNOSIS — Z01419 Encounter for gynecological examination (general) (routine) without abnormal findings: Secondary | ICD-10-CM

## 2020-04-20 NOTE — Patient Instructions (Signed)
Preventive Care 21-24 Years Old, Female Preventive care refers to visits with your health care provider and lifestyle choices that can promote health and wellness. This includes:  A yearly physical exam. This may also be called an annual well check.  Regular dental visits and eye exams.  Immunizations.  Screening for certain conditions.  Healthy lifestyle choices, such as eating a healthy diet, getting regular exercise, not using drugs or products that contain nicotine and tobacco, and limiting alcohol use. What can I expect for my preventive care visit? Physical exam Your health care provider will check your:  Height and weight. This may be used to calculate body mass index (BMI), which tells if you are at a healthy weight.  Heart rate and blood pressure.  Skin for abnormal spots. Counseling Your health care provider may ask you questions about your:  Alcohol, tobacco, and drug use.  Emotional well-being.  Home and relationship well-being.  Sexual activity.  Eating habits.  Work and work environment.  Method of birth control.  Menstrual cycle.  Pregnancy history. What immunizations do I need?  Influenza (flu) vaccine  This is recommended every year. Tetanus, diphtheria, and pertussis (Tdap) vaccine  You may need a Td booster every 10 years. Varicella (chickenpox) vaccine  You may need this if you have not been vaccinated. Human papillomavirus (HPV) vaccine  If recommended by your health care provider, you may need three doses over 6 months. Measles, mumps, and rubella (MMR) vaccine  You may need at least one dose of MMR. You may also need a second dose. Meningococcal conjugate (MenACWY) vaccine  One dose is recommended if you are age 19-21 years and a first-year college student living in a residence hall, or if you have one of several medical conditions. You may also need additional booster doses. Pneumococcal conjugate (PCV13) vaccine  You may need  this if you have certain conditions and were not previously vaccinated. Pneumococcal polysaccharide (PPSV23) vaccine  You may need one or two doses if you smoke cigarettes or if you have certain conditions. Hepatitis A vaccine  You may need this if you have certain conditions or if you travel or work in places where you may be exposed to hepatitis A. Hepatitis B vaccine  You may need this if you have certain conditions or if you travel or work in places where you may be exposed to hepatitis B. Haemophilus influenzae type b (Hib) vaccine  You may need this if you have certain conditions. You may receive vaccines as individual doses or as more than one vaccine together in one shot (combination vaccines). Talk with your health care provider about the risks and benefits of combination vaccines. What tests do I need?  Blood tests  Lipid and cholesterol levels. These may be checked every 5 years starting at age 20.  Hepatitis C test.  Hepatitis B test. Screening  Diabetes screening. This is done by checking your blood sugar (glucose) after you have not eaten for a while (fasting).  Sexually transmitted disease (STD) testing.  BRCA-related cancer screening. This may be done if you have a family history of breast, ovarian, tubal, or peritoneal cancers.  Pelvic exam and Pap test. This may be done every 3 years starting at age 21. Starting at age 30, this may be done every 5 years if you have a Pap test in combination with an HPV test. Talk with your health care provider about your test results, treatment options, and if necessary, the need for more tests.   Follow these instructions at home: Eating and drinking   Eat a diet that includes fresh fruits and vegetables, whole grains, lean protein, and low-fat dairy.  Take vitamin and mineral supplements as recommended by your health care provider.  Do not drink alcohol if: ? Your health care provider tells you not to drink. ? You are  pregnant, may be pregnant, or are planning to become pregnant.  If you drink alcohol: ? Limit how much you have to 0-1 drink a day. ? Be aware of how much alcohol is in your drink. In the [place] one drink equals one 12 oz bottle of beer (355 mL), one 5 oz glass of wine (148 mL), or one 1 oz glass of hard liquor (44 mL). Lifestyle  Take daily care of your teeth and gums.  Stay active. Exercise for at least 30 minutes on 5 or more days each week.  Do not use any products that contain nicotine or tobacco, such as cigarettes, e-cigarettes, and chewing tobacco. If you need help quitting, ask your health care provider.  If you are sexually active, practice safe sex. Use a condom or other form of birth control (contraception) in order to prevent pregnancy and STIs (sexually transmitted infections). If you plan to become pregnant, see your health care provider for a preconception visit. What's next?  Visit your health care provider once a year for a well check visit.  Ask your health care provider how often you should have your eyes and teeth checked.  Stay up to date on all vaccines. This information is not intended to replace advice given to you by your health care provider. Make sure you discuss any questions you have with your health care provider. Document Revised: 05/02/2018 Document Reviewed: 05/02/2018 Elsevier Patient Education  2020 [employer].
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
## 2020-06-11 ENCOUNTER — Ambulatory Visit (INDEPENDENT_AMBULATORY_CARE_PROVIDER_SITE_OTHER): Payer: Managed Care, Other (non HMO) | Admitting: Family Medicine

## 2020-06-11 ENCOUNTER — Encounter: Payer: Self-pay | Admitting: Family Medicine

## 2020-06-11 ENCOUNTER — Other Ambulatory Visit: Payer: Self-pay

## 2020-06-11 VITALS — BP 115/70 | HR 103 | Temp 98.6°F | Wt 207.0 lb

## 2020-06-11 DIAGNOSIS — L720 Epidermal cyst: Secondary | ICD-10-CM

## 2020-06-11 MED ORDER — CEPHALEXIN 250 MG PO CAPS: 500.0000 mg | ORAL_CAPSULE | Freq: Three times a day (TID) | ORAL | 0 refills | Status: DC

## 2020-06-11 NOTE — Patient Instructions (Signed)
Epidermal Cyst  An epidermal cyst is a sac made of skin tissue. The sac contains a substance called keratin. Keratin is a protein that is normally secreted through the hair follicles. When keratin becomes trapped in the top layer of skin (epidermis), it can form an epidermal cyst. Epidermal cysts can be found anywhere on your body. These cysts are usually harmless (benign), and they may not cause symptoms unless they become infected. What are the causes? This condition may be caused by:  A blocked hair follicle.  A hair that curls and re-enters the skin instead of growing straight out of the skin (ingrown hair).  A blocked pore.  Irritated skin.  An injury to the skin.  Certain conditions that are passed along from parent to child (inherited).  Human papillomavirus (HPV).  Long-term (chronic) sun damage to the skin. What increases the risk? The following factors may make you more likely to develop an epidermal cyst:  Having acne.  Being overweight.  Being 30-40 years old. What are the signs or symptoms? The only symptom of this condition may be a small, painless lump underneath the skin. When an epidermal cyst ruptures, it may become infected. Symptoms may include:  Redness.  Inflammation.  Tenderness.  Warmth.  Fever.  Keratin draining from the cyst. Keratin is grayish-white, bad-smelling substance.  Pus draining from the cyst. How is this diagnosed? This condition is diagnosed with a physical exam.  In some cases, you may have a sample of tissue (biopsy) taken from your cyst to be examined under a microscope or tested for bacteria.  You may be referred to a health care provider who specializes in skin care (dermatologist). How is this treated? In many cases, epidermal cysts go away on their own without treatment. If a cyst becomes infected, treatment may include:  Opening and draining the cyst, done by a health care provider. After draining, minor surgery to  remove the rest of the cyst may be done.  Antibiotic medicine.  Injections of medicines (steroids) that help to reduce inflammation.  Surgery to remove the cyst. Surgery may be done if the cyst: ? Becomes large. ? Bothers you. ? Has a chance of turning into cancer.  Do not try to open a cyst yourself. Follow these instructions at home:  Take over-the-counter and prescription medicines only as told by your health care provider.  If you were prescribed an antibiotic medicine, take it it as told by your health care provider. Do not stop using the antibiotic even if you start to feel better.  Keep the area around your cyst clean and dry.  Wear loose, dry clothing.  Avoid touching your cyst.  Check your cyst every day for signs of infection. Check for: ? Redness, swelling, or pain. ? Fluid or blood. ? Warmth. ? Pus or a bad smell.  Keep all follow-up visits as told by your health care provider. This is important. How is this prevented?  Wear clean, dry, clothing.  Avoid wearing tight clothing.  Keep your skin clean and dry. Take showers or baths every day. Contact a health care provider if:  Your cyst develops symptoms of infection.  Your condition is not improving or is getting worse.  You develop a cyst that looks different from other cysts you have had.  You have a fever. Get help right away if:  Redness spreads from the cyst into the surrounding area. Summary  An epidermal cyst is a sac made of skin tissue. These cysts are   usually harmless (benign), and they may not cause symptoms unless they become infected.  If a cyst becomes infected, treatment may include surgery to open and drain the cyst, or to remove it. Treatment may also include medicines by mouth or through an injection.  Take over-the-counter and prescription medicines only as told by your health care provider. If you were prescribed an antibiotic medicine, take it as told by your health care  provider. Do not stop using the antibiotic even if you start to feel better.  Contact a health care provider if your condition is not improving or is getting worse.  Keep all follow-up visits as told by your health care provider. This is important. This information is not intended to replace advice given to you by your health care provider. Make sure you discuss any questions you have with your health care provider. Document Revised: 12/12/2018 Document Reviewed: 03/04/2018 Elsevier Patient Education  2020 [employer]  
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
## 2020-06-15 ENCOUNTER — Encounter: Payer: Self-pay | Admitting: Family Medicine

## 2020-06-15 ENCOUNTER — Ambulatory Visit (INDEPENDENT_AMBULATORY_CARE_PROVIDER_SITE_OTHER): Payer: Managed Care, Other (non HMO) | Admitting: Family Medicine

## 2020-06-15 ENCOUNTER — Other Ambulatory Visit: Payer: Self-pay

## 2020-06-15 VITALS — BP 117/75 | HR 88 | Temp 98.8°F

## 2020-06-15 DIAGNOSIS — L02426 Furuncle of left lower limb: Secondary | ICD-10-CM

## 2020-06-15 MED ORDER — CEPHALEXIN 250 MG PO CAPS: 500.0000 mg | ORAL_CAPSULE | Freq: Three times a day (TID) | ORAL | 0 refills | Status: DC
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
## 2020-06-22 ENCOUNTER — Ambulatory Visit (INDEPENDENT_AMBULATORY_CARE_PROVIDER_SITE_OTHER): Payer: Managed Care, Other (non HMO) | Admitting: Family Medicine

## 2020-06-22 ENCOUNTER — Other Ambulatory Visit: Payer: Self-pay

## 2020-06-22 ENCOUNTER — Encounter: Payer: Self-pay | Admitting: Family Medicine

## 2020-06-22 VITALS — BP 108/65 | HR 88 | Temp 98.8°F

## 2020-06-22 DIAGNOSIS — L02426 Furuncle of left lower limb: Secondary | ICD-10-CM | POA: Diagnosis not present
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
## 2020-07-21 ENCOUNTER — Other Ambulatory Visit: Payer: Self-pay | Admitting: Physician Assistant

## 2020-07-21 DIAGNOSIS — F32A Depression, unspecified: Secondary | ICD-10-CM

## 2020-07-21 DIAGNOSIS — G43809 Other migraine, not intractable, without status migrainosus: Secondary | ICD-10-CM

## 2020-08-06 ENCOUNTER — Telehealth (INDEPENDENT_AMBULATORY_CARE_PROVIDER_SITE_OTHER): Payer: Managed Care, Other (non HMO) | Admitting: Family Medicine

## 2020-08-06 ENCOUNTER — Encounter: Payer: Self-pay | Admitting: Family Medicine

## 2020-08-06 DIAGNOSIS — R0981 Nasal congestion: Secondary | ICD-10-CM

## 2020-08-06 DIAGNOSIS — R059 Cough, unspecified: Secondary | ICD-10-CM

## 2020-08-06 MED ORDER — AMOXICILLIN-POT CLAVULANATE 875-125 MG PO TABS: 1.0000 | ORAL_TABLET | Freq: Two times a day (BID) | ORAL | 0 refills | Status: DC
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
## 2020-09-10 ENCOUNTER — Other Ambulatory Visit: Payer: Self-pay | Admitting: Physician Assistant

## 2020-09-10 DIAGNOSIS — G43809 Other migraine, not intractable, without status migrainosus: Secondary | ICD-10-CM

## 2020-09-10 NOTE — Telephone Encounter (Signed)
Requested medication (s) are due for refill today:no  Requested medication (s) are on the active medication list: yes   Last refill:  07/21/2020  Future visit scheduled:no  Notes to clinic: this refill cannot be delegated   Requested Prescriptions  Pending Prescriptions Disp Refills   topiramate (TOPAMAX) 50 MG tablet [Pharmacy Med Name: TOPIRAMATE 50 MG TAB] 30 tablet 2    Sig: TAKE 1 TABLET BY MOUTH DAILY.      Not Delegated - Neurology: Anticonvulsants - topiramate & zonisamide Failed - 09/10/2020  9:43 AM      Failed - This refill cannot be delegated      Failed - Cr in normal range and within 360 days    Creatinine  Date Value Ref Range Status  05/27/2013 0.96 0.60 - 1.30 mg/dL Final   Creatinine, Ser  Date Value Ref Range Status  03/14/2018 0.84 0.57 - 1.00 mg/dL Final          Failed - CO2 in normal range and within 360 days    CO2  Date Value Ref Range Status  03/14/2018 20 20 - 29 mmol/L Final   Co2  Date Value Ref Range Status  05/27/2013 26 (H) 16 - 25 mmol/L Final          Passed - Valid encounter within last 12 months    Recent Outpatient Visits           1 month ago Head congestion   [employer], Jodell Cipro, PA-C   2 months ago Furuncle of left thigh   [employer], Jodell Cipro, PA-C   2 months ago Furuncle of left thigh   [employer], Jodell Cipro, PA-C   3 months ago Epidermal cyst   [employer], Jodell Cipro, PA-C   5 months ago Cough   [employer], Lavella Hammock, [place]       Future Appointments             In 7 months Doreene Burke, CNM [facility]

## 2020-12-06 ENCOUNTER — Ambulatory Visit (INDEPENDENT_AMBULATORY_CARE_PROVIDER_SITE_OTHER): Payer: Managed Care, Other (non HMO) | Admitting: Certified Nurse Midwife

## 2020-12-06 ENCOUNTER — Other Ambulatory Visit: Payer: Self-pay

## 2020-12-06 ENCOUNTER — Other Ambulatory Visit (HOSPITAL_COMMUNITY)
Admission: RE | Admit: 2020-12-06 | Discharge: 2020-12-06 | Disposition: A | Discharge status: Home / Self Care | Payer: Self-pay | Source: Ambulatory Visit | Attending: Certified Nurse Midwife | Admitting: Certified Nurse Midwife

## 2020-12-06 ENCOUNTER — Encounter: Payer: Self-pay | Admitting: Certified Nurse Midwife

## 2020-12-06 VITALS — BP 106/72 | HR 81 | Ht 66.0 in | Wt 204.7 lb

## 2020-12-06 DIAGNOSIS — Z113 Encounter for screening for infections with a predominantly sexual mode of transmission: Secondary | ICD-10-CM

## 2020-12-06 LAB — POCT URINE PREGNANCY: Preg Test, Ur: NEGATIVE
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
## 2020-12-06 NOTE — Progress Notes (Signed)
Pt present for STD screenings. Pt stated that she wanted to be checked for all STD's and pregnancy test. UPT-NEG.

## 2020-12-06 NOTE — Patient Instructions (Signed)
Safe Sex Practicing safe sex means taking steps before and during sex to reduce your risk of:  Getting an STI (sexually transmitted infection).  Giving your partner an STI.  Unwanted or unplanned pregnancy. How to practice safe sex Ways you can practice safe sex  Limit your sexual partners to only one partner who is having sex with only you.  Avoid using alcohol and drugs before having sex. Alcohol and drugs can affect your judgment.  Before having sex with a new partner: ? Talk to your partner about past partners, past STIs, and drug use. ? Get screened for STIs and discuss the results with your partner. Ask your partner to get screened too.  Check your body regularly for sores, blisters, rashes, or unusual discharge. If you notice any of these problems, visit your health care provider.  Avoid sexual contact if you have symptoms of an infection or you are being treated for an STI.  While having sex, use a condom. Make sure to: ? Use a condom every time you have vaginal, oral, or anal sex. Both females and males should wear condoms during oral sex. ? Keep condoms in place from the beginning to the end of sexual activity. ? Use a latex condom, if possible. Latex condoms offer the best protection. ? Use only water-based lubricants with a condom. Using petroleum-based lubricants or oils will weaken the condom and increase the chance that it will break.   Ways your health care provider can help you practice safe sex  See your health care provider for regular screenings, exams, and tests for STIs.  Talk with your health care provider about what kind of birth control (contraception) is best for you.  Get vaccinated against hepatitis B and human papillomavirus (HPV).  If you are at risk of being infected with HIV (human immunodeficiency virus), talk with your health care provider about taking a prescription medicine to prevent HIV infection. You are at risk for HIV if you: ? Are a man  who has sex with other men. ? Are sexually active with more than one partner. ? Take drugs by injection. ? Have a sex partner who has HIV. ? Have unprotected sex. ? Have sex with someone who has sex with both men and women. ? Have had an STI.   Follow these instructions at home:  Take over-the-counter and prescription medicines only as told by your health care provider.  Keep all follow-up visits. This is important. Where to find more information  Centers for Disease Control and Prevention: www.cdc.gov  Planned Parenthood: www.plannedparenthood.org  Office on Women's Health: www.womenshealth.gov Summary  Practicing safe sex means taking steps before and during sex to reduce your risk getting an STI, giving your partner an STI, and having an unwanted or unplanned pregnancy.  Before having sex with a new partner, talk to your partner about past partners, past STIs, and drug use.  Use a condom every time you have vaginal, oral, or anal sex. Both females and males should wear condoms during oral sex.  Check your body regularly for sores, blisters, rashes, or unusual discharge. If you notice any of these problems, visit your health care provider.  See your health care provider for regular screenings, exams, and tests for STIs. This information is not intended to replace advice given to you by your health care provider. Make sure you discuss any questions you have with your health care provider. Document Revised: 01/26/2020 Document Reviewed: 01/26/2020 Elsevier Patient Education  2021 Elsevier Inc.  

## 2020-12-07 ENCOUNTER — Other Ambulatory Visit: Payer: Self-pay | Admitting: Certified Nurse Midwife

## 2020-12-07 LAB — CERVICOVAGINAL ANCILLARY ONLY
Bacterial Vaginitis (gardnerella): POSITIVE — AB
Candida Glabrata: NEGATIVE
Candida Vaginitis: NEGATIVE
Chlamydia: NEGATIVE
Comment: NEGATIVE
Comment: NEGATIVE
Comment: NEGATIVE
Comment: NEGATIVE
Comment: NEGATIVE
Comment: NORMAL
Neisseria Gonorrhea: NEGATIVE
Trichomonas: NEGATIVE

## 2020-12-07 LAB — RPR: RPR Ser Ql: NONREACTIVE

## 2020-12-07 LAB — HEPATITIS C ANTIBODY: Hep C Virus Ab: <0.1 s/co ratio (ref 0.0–0.9)

## 2020-12-07 LAB — HIV ANTIBODY (ROUTINE TESTING W REFLEX): HIV Screen 4th Generation wRfx: NONREACTIVE

## 2020-12-07 LAB — HEPATITIS B SURFACE ANTIGEN: Hepatitis B Surface Ag: NEGATIVE

## 2020-12-07 IMAGING — DX DG CHEST 1V
1 series · 1 of 1 positions shown · non-contrast
Comparison: 06/22/2011

CLINICAL DATA: Follow-up physical exam

EXAM:
CHEST  1 VIEW

[chest pa]
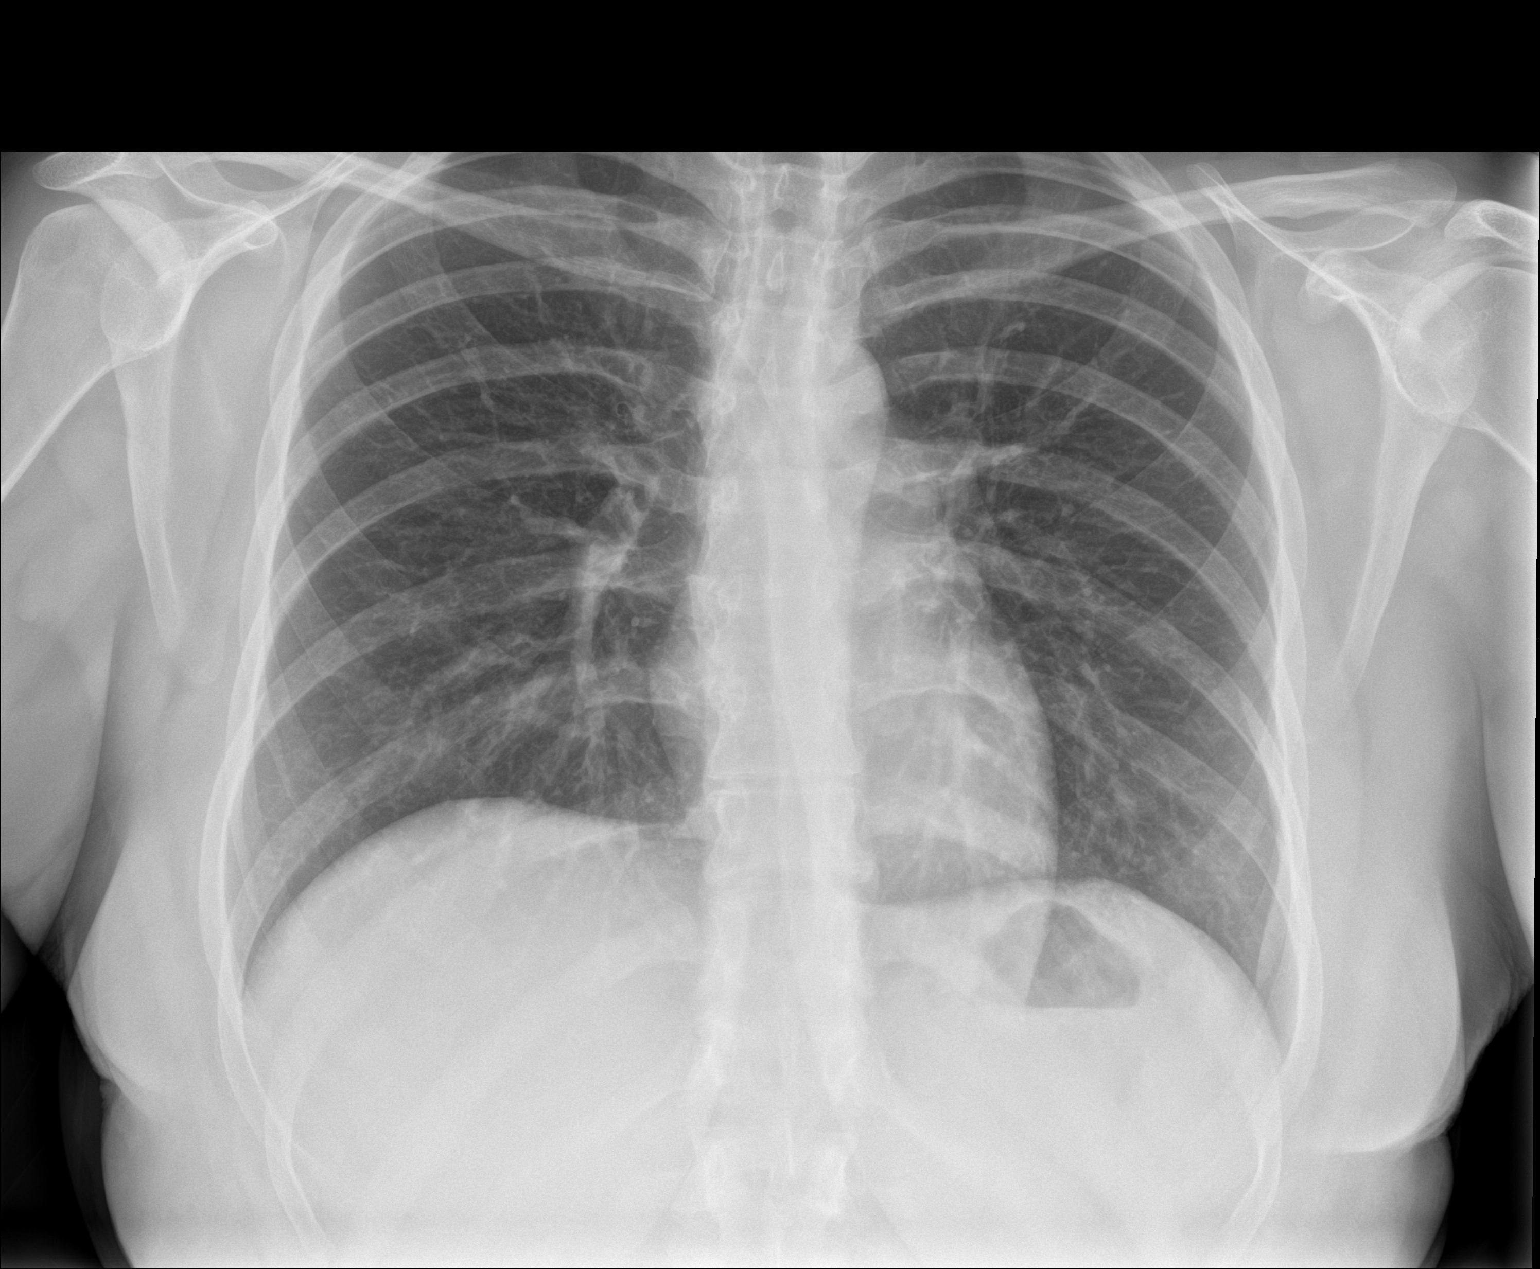

[1 of 1 positions shown; findings below may reference images not displayed]

FINDINGS: The heart size and mediastinal contours are within normal limits.
Both lungs are clear. The visualized skeletal structures are
unremarkable.
IMPRESSION: No active disease.

## 2020-12-07 MED ORDER — METRONIDAZOLE 500 MG PO TABS: 500.0000 mg | ORAL_TABLET | Freq: Two times a day (BID) | ORAL | 0 refills | Status: AC

## 2020-12-07 NOTE — Progress Notes (Signed)
Swab positive for BV , orders placed for treatment. Pt notified via my chart.   Maleaha Hughett, CNM 

## 2021-03-10 ENCOUNTER — Telehealth (INDEPENDENT_AMBULATORY_CARE_PROVIDER_SITE_OTHER): Payer: Managed Care, Other (non HMO) | Admitting: Family Medicine

## 2021-03-10 ENCOUNTER — Encounter: Payer: Self-pay | Admitting: Family Medicine

## 2021-03-10 VITALS — Temp 99.8°F

## 2021-03-10 DIAGNOSIS — J029 Acute pharyngitis, unspecified: Secondary | ICD-10-CM

## 2021-03-10 DIAGNOSIS — J0140 Acute pansinusitis, unspecified: Secondary | ICD-10-CM | POA: Diagnosis not present

## 2021-03-10 MED ORDER — AZITHROMYCIN 250 MG PO TABS: ORAL_TABLET | ORAL | 0 refills | Status: AC
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
## 2021-04-25 ENCOUNTER — Encounter: Payer: Self-pay | Admitting: Certified Nurse Midwife

## 2021-05-25 ENCOUNTER — Other Ambulatory Visit: Payer: Self-pay

## 2021-05-25 ENCOUNTER — Ambulatory Visit (INDEPENDENT_AMBULATORY_CARE_PROVIDER_SITE_OTHER): Payer: Medicaid Other | Admitting: Certified Nurse Midwife

## 2021-05-25 ENCOUNTER — Encounter: Payer: Self-pay | Admitting: Certified Nurse Midwife

## 2021-05-25 VITALS — BP 113/78 | HR 85 | Ht 68.0 in | Wt 207.0 lb

## 2021-05-25 DIAGNOSIS — Z01419 Encounter for gynecological examination (general) (routine) without abnormal findings: Secondary | ICD-10-CM | POA: Diagnosis not present
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
## 2021-05-25 NOTE — Patient Instructions (Signed)
Preventive Care 21-25 Years Old, Female Preventive care refers to lifestyle choices and visits with your health care provider that can promote health and wellness. This includes: A yearly physical exam. This is also called an annual wellness visit. Regular dental and eye exams. Immunizations. Screening for certain conditions. Healthy lifestyle choices, such as: Eating a healthy diet. Getting regular exercise. Not using drugs or products that contain nicotine and tobacco. Limiting alcohol use. What can I expect for my preventive care visit? Physical exam Your health care provider may check your: Height and weight. These may be used to calculate your BMI (body mass index). BMI is a measurement that tells if you are at a healthy weight. Heart rate and blood pressure. Body temperature. Skin for abnormal spots. Counseling Your health care provider may ask you questions about your: Past medical problems. Family's medical history. Alcohol, tobacco, and drug use. Emotional well-being. Home life and relationship well-being. Sexual activity. Diet, exercise, and sleep habits. Work and work environment. Access to firearms. Method of birth control. Menstrual cycle. Pregnancy history. What immunizations do I need? Vaccines are usually given at various ages, according to a schedule. Your health care provider will recommend vaccines for you based on your age, medical history, and lifestyle or other factors, such as travel or where you work. What tests do I need? Blood tests Lipid and cholesterol levels. These may be checked every 5 years starting at age 20. Hepatitis C test. Hepatitis B test. Screening Diabetes screening. This is done by checking your blood sugar (glucose) after you have not eaten for a while (fasting). STD (sexually transmitted disease) testing, if you are at risk. BRCA-related cancer screening. This may be done if you have a family history of breast, ovarian, tubal, or  peritoneal cancers. Pelvic exam and Pap test. This may be done every 3 years starting at age 21. Starting at age 30, this may be done every 5 years if you have a Pap test in combination with an HPV test. Talk with your health care provider about your test results, treatment options, and if necessary, the need for more tests. Follow these instructions at home: Eating and drinking  Eat a healthy diet that includes fresh fruits and vegetables, whole grains, lean protein, and low-fat dairy products. Take vitamin and mineral supplements as recommended by your health care provider. Do not drink alcohol if: Your health care provider tells you not to drink. You are pregnant, may be pregnant, or are planning to become pregnant. If you drink alcohol: Limit how much you have to 0-1 drink a day. Be aware of how much alcohol is in your drink. In the [place] one drink equals one 12 oz bottle of beer (355 mL), one 5 oz glass of wine (148 mL), or one 1 oz glass of hard liquor (44 mL). Lifestyle Take daily care of your teeth and gums. Brush your teeth every morning and night with fluoride toothpaste. Floss one time each day. Stay active. Exercise for at least 30 minutes 5 or more days each week. Do not use any products that contain nicotine or tobacco, such as cigarettes, e-cigarettes, and chewing tobacco. If you need help quitting, ask your health care provider. Do not use drugs. If you are sexually active, practice safe sex. Use a condom or other form of protection to prevent STIs (sexually transmitted infections). If you do not wish to become pregnant, use a form of birth control. If you plan to become pregnant, see your health care provider   for a prepregnancy visit. Find healthy ways to cope with stress, such as: Meditation, yoga, or listening to music. Journaling. Talking to a trusted person. Spending time with friends and family. Safety Always wear your seat belt while driving or riding in a  vehicle. Do not drive: If you have been drinking alcohol. Do not ride with someone who has been drinking. When you are tired or distracted. While texting. Wear a helmet and other protective equipment during sports activities. If you have firearms in your house, make sure you follow all gun safety procedures. Seek help if you have been physically or sexually abused. What's next? Go to your health care provider once a year for an annual wellness visit. Ask your health care provider how often you should have your eyes and teeth checked. Stay up to date on all vaccines. This information is not intended to replace advice given to you by your health care provider. Make sure you discuss any questions you have with your health care provider. Document Revised: 10/29/2020 Document Reviewed: 05/02/2018 Elsevier Patient Education  2022 Elsevier Inc.  

## 2021-05-25 NOTE — Progress Notes (Signed)
Pt declines flu 

## 2021-09-19 ENCOUNTER — Encounter: Payer: Self-pay | Admitting: Physician Assistant

## 2021-09-19 ENCOUNTER — Ambulatory Visit (INDEPENDENT_AMBULATORY_CARE_PROVIDER_SITE_OTHER): Payer: BC Managed Care – PPO | Admitting: Physician Assistant

## 2021-09-19 ENCOUNTER — Other Ambulatory Visit: Payer: Self-pay

## 2021-09-19 ENCOUNTER — Ambulatory Visit: Payer: Self-pay | Admitting: *Deleted

## 2021-09-19 VITALS — BP 103/72 | HR 72 | Ht 65.5 in | Wt 221.0 lb

## 2021-09-19 DIAGNOSIS — J029 Acute pharyngitis, unspecified: Secondary | ICD-10-CM | POA: Diagnosis not present

## 2021-09-19 LAB — POCT RAPID STREP A (OFFICE): Rapid Strep A Screen: NEGATIVE

## 2021-09-19 NOTE — Telephone Encounter (Signed)
Reason for Disposition  [1] MILD longstanding difficulty breathing AND [2]  SAME as normal  Answer Assessment - Initial Assessment Questions 1. RESPIRATORY STATUS: "Describe your breathing?" (e.g., wheezing, shortness of breath, unable to speak, severe coughing)      Pt calling in c/o sore throat, sinus congestion is making it hard to breath.   Denies chest pain or shortness of breath. 2. ONSET: "When did this breathing problem begin?"      Thur night. 3. PATTERN "Does the difficult breathing come and go, or has it been constant since it started?"      *No Answer* 4. SEVERITY: "How bad is your breathing?" (e.g., mild, moderate, severe)    - MILD: No SOB at rest, mild SOB with walking, speaks normally in sentences, can lie down, no retractions, pulse < 100.    - MODERATE: SOB at rest, SOB with minimal exertion and prefers to sit, cannot lie down flat, speaks in phrases, mild retractions, audible wheezing, pulse 100-120.    - SEVERE: Very SOB at rest, speaks in single words, struggling to breathe, sitting hunched forward, retractions, pulse > 120      *No Answer* 5. RECURRENT SYMPTOM: "Have you had difficulty breathing before?" If Yes, ask: "When was the last time?" and "What happened that time?"      *No Answer* 6. CARDIAC HISTORY: "Do you have any history of heart disease?" (e.g., heart attack, angina, bypass surgery, angioplasty)      *No Answer* 7. LUNG HISTORY: "Do you have any history of lung disease?"  (e.g., pulmonary embolus, asthma, emphysema)     *No Answer* 8. CAUSE: "What do you think is causing the breathing problem?"      *No Answer* 9. OTHER SYMPTOMS: "Do you have any other symptoms? (e.g., dizziness, runny nose, cough, chest pain, fever)     *No Answer* 10. O2 SATURATION MONITOR:  "Do you use an oxygen saturation monitor (pulse oximeter) at home?" If Yes, "What is your reading (oxygen level) today?" "What is your usual oxygen saturation reading?" (e.g., 95%)       *No  Answer* 11. PREGNANCY: "Is there any chance you are pregnant?" "When was your last menstrual period?"       *No Answer* 12. TRAVEL: "Have you traveled out of the country in the last month?" (e.g., travel history, exposures)       *No Answer*  Protocols used: Breathing Difficulty-A-AH

## 2021-09-19 NOTE — Telephone Encounter (Signed)
°  Chief Complaint: sore throat and sinus congestion Symptoms: nasal congestion, sore throat Frequency: Since Thur. Pertinent Negatives: Patient denies shortness of breath or chest pain/tightness Disposition: [] ED /[] Urgent Care (no appt availability in office) / [x] Appointment(In office/virtual)/ []  [place] [facility]/ [] Home Care/ [] Refused Recommended Disposition /[]  Mobile Bus/ []  Follow-up with PCP Additional Notes: Appt for today with for 10:00.

## 2021-09-19 NOTE — Progress Notes (Signed)
Emily Simon,acting as a Neurosurgeon for [employer], PA-C.,have documented all relevant documentation on the behalf of Emily Ferguson, PA-C,as directed by  Emily Ferguson, PA-C while in the presence of Emily Ferguson, PA-C.  Established patient visit   Patient: Emily Simon   DOB: 08-27-1996   26 y.o. Female  MRN: [id number] Visit Date: 09/19/2021  Today's healthcare provider: [name], PA-C   Chief Complaint  Patient presents with   Facial Pain   Subjective    HPI   [name] is a 26 y/o female who presents today with sore throat, nasal congestion, chills, dry cough, unknown if temperature. Some pressure in her ears. Denies any sick contacts denies SOB, wheezing, fevers. Denies taking covid test at home.      Medications: Outpatient Medications Prior to Visit  Medication Sig   ibuprofen (ADVIL,MOTRIN) 600 MG tablet Take by mouth.   levonorgestrel (MIRENA) 20 MCG/24HR IUD 1 each by Intrauterine route once.   Oral Electrolytes (EMERGEN-C ELECTRO MIX) PACK Take by mouth.   amitriptyline (ELAVIL) 50 MG tablet TAKE 1 TABLET BY MOUTH AT BEDTIME. (Patient not taking: Reported on 09/19/2021)   topiramate (TOPAMAX) 50 MG tablet Take 1 tablet (50 mg total) by mouth daily. (Patient not taking: Reported on 09/19/2021)   No facility-administered medications prior to visit.    Review of Systems  Constitutional:  Positive for fatigue.  HENT:  Positive for congestion, ear pain, postnasal drip, rhinorrhea, sinus pain and sore throat.   Respiratory:  Positive for cough. Negative for shortness of breath and wheezing.   Cardiovascular:  Negative for chest pain.  Neurological:  Negative for headaches.      Objective    Blood pressure 103/72, pulse 72, height 5' 5.5" (1.664 m), weight 221 lb (100.2 kg), last menstrual period 09/18/2021, SpO2 98 %.   Physical Exam Constitutional:      General: She is awake.     Appearance: She is well-developed.  HENT:     Head:  Normocephalic.     Right Ear: Tympanic membrane normal.     Left Ear: Tympanic membrane normal.     Nose: Congestion present.     Mouth/Throat:     Mouth: Mucous membranes are dry.     Pharynx: Posterior oropharyngeal erythema present.  Eyes:     Conjunctiva/sclera: Conjunctivae normal.  Cardiovascular:     Rate and Rhythm: Normal rate and regular rhythm.     Heart sounds: Normal heart sounds.  Pulmonary:     Effort: Pulmonary effort is normal.     Breath sounds: Normal breath sounds.  Skin:    General: Skin is warm.  Neurological:     Mental Status: She is alert and oriented to person, place, and time.  Psychiatric:        Attention and Perception: Attention normal.        Mood and Affect: Mood normal.        Speech: Speech normal.        Behavior: Behavior is cooperative.     Results for orders placed or performed in visit on 09/19/21  POCT rapid strep A  Result Value Ref Range   Rapid Strep A Screen Negative Negative    Assessment & Plan     Acute pharyngitis Likely viral, rapid strep test negative. Offered to test for covid in office but turnaround is 1-2 days. Pt states she will do home test. Ok to take dayquil, increase fluids, advil/tylenol for pain/fever  Return if symptoms worsen  or fail to improve.      I, Emily Ferguson, PA-C have reviewed all documentation for this visit. The documentation on  09/19/2021 for the exam, diagnosis, procedures, and orders are all accurate and complete.    Emily Ferguson, PA-C  [facility] [phone] (phone) [phone] (fax)  [facility]

## 2021-12-13 ENCOUNTER — Ambulatory Visit (INDEPENDENT_AMBULATORY_CARE_PROVIDER_SITE_OTHER): Payer: BC Managed Care – PPO | Admitting: Family Medicine

## 2021-12-13 ENCOUNTER — Encounter: Payer: Self-pay | Admitting: Family Medicine

## 2021-12-13 VITALS — BP 110/68 | HR 91 | Temp 97.3°F | Resp 16 | Wt 218.0 lb

## 2021-12-13 DIAGNOSIS — L209 Atopic dermatitis, unspecified: Secondary | ICD-10-CM

## 2021-12-13 DIAGNOSIS — J0140 Acute pansinusitis, unspecified: Secondary | ICD-10-CM | POA: Diagnosis not present

## 2021-12-13 MED ORDER — FLUTICASONE PROPIONATE 50 MCG/ACT NA SUSP: 2.0000 | Freq: Every day | NASAL | 6 refills | Status: AC

## 2021-12-13 MED ORDER — TRIAMCINOLONE ACETONIDE 0.1 % EX CREA: 1.0000 "application " | TOPICAL_CREAM | Freq: Two times a day (BID) | CUTANEOUS | 0 refills | Status: DC

## 2021-12-13 MED ORDER — AZITHROMYCIN 250 MG PO TABS: ORAL_TABLET | ORAL | 0 refills | Status: DC

## 2021-12-13 MED ORDER — DM-GUAIFENESIN ER 30-600 MG PO TB12: 1.0000 | ORAL_TABLET | Freq: Two times a day (BID) | ORAL | 0 refills | Status: DC

## 2021-12-13 NOTE — Assessment & Plan Note (Signed)
Unrelated to rash ?Acute, non recurrent ?Pan sinusitis ?Will treat with Abx; confirmed with patient that she has taken this Abx previously without difficulty d/t red dye ?Continue oral antihistamine, recommend use of flonase and mucinex to assist ?RTC in 2 weeks if symptoms return/do not improve ?

## 2021-12-13 NOTE — Assessment & Plan Note (Signed)
New concern, resolving since appt was made ?1 area remains on L forearm ?Rash was on back, chest, and legs ?Patient believes that she was exposed to the solution that she used to clean pet accident from her couch; "rug doctor solution" ?Denies new medications, supplements, soaps, other cleaners, laundry detergents ?Tried oatmeal bath x3 baths; majority of rash cleared s/p baths ?BID mid dose steroid provided to assist with LUE; 7 days max ?RTC if symptoms worsen/excerbate  ?

## 2021-12-13 NOTE — Progress Notes (Signed)
?  ?[employer] as a Neurosurgeon for Jacky Kindle, FNP.,have documented all relevant documentation on the behalf of Jacky Kindle, FNP,as directed by  Jacky Kindle, FNP while in the presence of Jacky Kindle, FNP.  ? ?Established patient visit ? ? ?Patient: Emily Simon   DOB: Jan 31, 1996   26 y.o. Female  MRN: [id number] ?Visit Date: 12/13/2021 ? ?Today's healthcare provider: Jacky Kindle, FNP  ? ?Introduced to Publishing rights manager role and practice setting.  All questions answered.  Discussed provider/patient relationship and expectations. ? ? ?Chief Complaint  ?Patient presents with  ? Allergic Reaction  ? ?Subjective  ?  ?Rash ?This is a new problem. The current episode started in the past 7 days. The problem has been gradually worsening since onset. The affected locations include the back, torso, right upper leg and left upper leg. The rash is characterized by itchiness, redness and peeling. She was exposed to chemicals (patient states that her cat had a "accident" on couch and believes that chemicals she used caused reaction). Associated symptoms include congestion, coughing and a sore throat. Pertinent negatives include no anorexia, diarrhea, eye pain, facial edema, fatigue, fever, joint pain, nail changes, rhinorrhea, shortness of breath or vomiting. Treatments tried: oatmeal bath. The treatment provided significant relief.   ? ? ?Medications: ?Outpatient Medications Prior to Visit  ?Medication Sig  ? ibuprofen (ADVIL,MOTRIN) 600 MG tablet Take by mouth.  ? levonorgestrel (MIRENA) 20 MCG/24HR IUD 1 each by Intrauterine route once.  ? Oral Electrolytes (EMERGEN-C ELECTRO MIX) PACK Take by mouth.  ? amitriptyline (ELAVIL) 50 MG tablet TAKE 1 TABLET BY MOUTH AT BEDTIME. (Patient not taking: Reported on 09/19/2021)  ? topiramate (TOPAMAX) 50 MG tablet Take 1 tablet (50 mg total) by mouth daily. (Patient not taking: Reported on 09/19/2021)  ? ?No facility-administered medications prior to visit.   ? ? ?Review of Systems  ?Constitutional:  Negative for fatigue and fever.  ?HENT:  Positive for congestion and sore throat. Negative for rhinorrhea.   ?Eyes:  Negative for pain.  ?Respiratory:  Positive for cough. Negative for shortness of breath.   ?Gastrointestinal:  Negative for anorexia, diarrhea and vomiting.  ?Musculoskeletal:  Negative for joint pain.  ?Skin:  Positive for rash. Negative for nail changes.  ? ? ?  Objective  ?  ?BP 110/68   Pulse 91   Temp (!) 97.3 ?F (36.3 ?C) (Temporal)   Resp 16   Wt 218 lb (98.9 kg)   SpO2 100%   BMI 35.73 kg/m?  ? ? ?Physical Exam ?Vitals and nursing note reviewed.  ?Constitutional:   ?   General: She is not in acute distress. ?   Appearance: Normal appearance. She is obese. She is not ill-appearing, toxic-appearing or diaphoretic.  ?HENT:  ?   Head: Normocephalic and atraumatic.  ?   Nose: Congestion present. No rhinorrhea.  ?   Right Sinus: Maxillary sinus tenderness and frontal sinus tenderness present.  ?   Left Sinus: Maxillary sinus tenderness and frontal sinus tenderness present.  ?Cardiovascular:  ?   Rate and Rhythm: Normal rate and regular rhythm.  ?   Pulses: Normal pulses.  ?   Heart sounds: Normal heart sounds. No murmur heard. ?  No friction rub. No gallop.  ?Pulmonary:  ?   Effort: Pulmonary effort is normal. No respiratory distress.  ?   Breath sounds: Normal breath sounds. No stridor. No wheezing, rhonchi or rales.  ?Chest:  ?   Chest wall: No  tenderness.  ?Abdominal:  ?   General: Bowel sounds are normal.  ?   Palpations: Abdomen is soft.  ?Musculoskeletal:     ?   General: No swelling, tenderness, deformity or signs of injury. Normal range of motion.  ?   Right lower leg: No edema.  ?   Left lower leg: No edema.  ?Skin: ?   General: Skin is warm and dry.  ?   Capillary Refill: Capillary refill takes less than 2 seconds.  ?   Coloration: Skin is not jaundiced or pale.  ?   Findings: Rash present. No bruising, erythema or lesion.  ? ?     ?Neurological:  ?   General: No focal deficit present.  ?   Mental Status: She is alert and oriented to person, place, and time. Mental status is at baseline.  ?   Cranial Nerves: No cranial nerve deficit.  ?   Sensory: No sensory deficit.  ?   Motor: No weakness.  ?   Coordination: Coordination normal.  ?Psychiatric:     ?   Mood and Affect: Mood normal.     ?   Behavior: Behavior normal.     ?   Thought Content: Thought content normal.     ?   Judgment: Judgment normal.  ?  ? ?No results found for any visits on 12/13/21. ? Assessment & Plan  ?  ? ?Problem List Items Addressed This Visit   ? ?  ? Respiratory  ? Acute non-recurrent pansinusitis  ?  Unrelated to rash ?Acute, non recurrent ?Pan sinusitis ?Will treat with Abx; confirmed with patient that she has taken this Abx previously without difficulty d/t red dye ?Continue oral antihistamine, recommend use of flonase and mucinex to assist ?RTC in 2 weeks if symptoms return/do not improve ?  ?  ? Relevant Medications  ? azithromycin (ZITHROMAX) 250 MG tablet  ? fluticasone (FLONASE) 50 MCG/ACT nasal spray  ? dextromethorphan-guaiFENesin (MUCINEX DM) 30-600 MG 12hr tablet  ?  ? Musculoskeletal and Integument  ? Atopic dermatitis in adult - Primary  ?  New concern, resolving since appt was made ?1 area remains on L forearm ?Rash was on back, chest, and legs ?Patient believes that she was exposed to the solution that she used to clean pet accident from her couch; "rug doctor solution" ?Denies new medications, supplements, soaps, other cleaners, laundry detergents ?Tried oatmeal bath x3 baths; majority of rash cleared s/p baths ?BID mid dose steroid provided to assist with LUE; 7 days max ?RTC if symptoms worsen/excerbate  ?  ?  ? Relevant Medications  ? triamcinolone cream (KENALOG) 0.1 %  ? ? ? ?Return in about 2 weeks (around 12/27/2021), or if symptoms worsen or fail to improve.  ?   ? ?I, Jacky Kindle, FNP, have reviewed all documentation for this visit. The  documentation on 12/13/21 for the exam, diagnosis, procedures, and orders are all accurate and complete. ? ?Jacky Kindle, FNP  ?[facility] ?[phone] (phone) ?[phone] (fax) ? ?[facility] ?

## 2022-02-07 NOTE — Progress Notes (Unsigned)
    I,Sha'taria Tyson,acting as a Neurosurgeon for [employer], PA-C.,have documented all relevant documentation on the behalf of [name], PA-C,as directed by  [name], PA-C while in the presence of [name], PA-C.   Acute Office Visit  Subjective:     Patient ID: Emily Simon, female    DOB: 12/16/1995, 26 y.o.   MRN: [id number]  No chief complaint on file.   HPI Patient is in today for allergies/ear issues.  ROS      Objective:    There were no vitals taken for this visit. {Vitals History (Optional):23777}  Physical Exam  No results found for any visits on 02/08/22.      Assessment & Plan:   Problem List Items Addressed This Visit   None   No orders of the defined types were placed in this encounter.   No follow-ups on file.  Acey Lav, CMA

## 2022-02-08 ENCOUNTER — Ambulatory Visit: Payer: BC Managed Care – PPO | Admitting: Physician Assistant

## 2022-02-08 ENCOUNTER — Encounter: Payer: Self-pay | Admitting: Physician Assistant

## 2022-02-08 VITALS — BP 117/81 | HR 69 | Ht 65.5 in | Wt 221.1 lb

## 2022-02-08 DIAGNOSIS — J302 Other seasonal allergic rhinitis: Secondary | ICD-10-CM

## 2022-02-08 MED ORDER — METHYLPREDNISOLONE 4 MG PO TBPK: ORAL_TABLET | ORAL | 0 refills | Status: DC

## 2022-02-08 MED ORDER — AZELASTINE HCL 0.1 % NA SOLN: 1.0000 | Freq: Two times a day (BID) | NASAL | 3 refills | Status: DC

## 2022-04-25 ENCOUNTER — Encounter: Payer: Self-pay | Admitting: Certified Nurse Midwife

## 2022-04-25 ENCOUNTER — Ambulatory Visit (INDEPENDENT_AMBULATORY_CARE_PROVIDER_SITE_OTHER): Payer: BC Managed Care – PPO | Admitting: Certified Nurse Midwife

## 2022-04-25 ENCOUNTER — Encounter: Payer: BC Managed Care – PPO | Admitting: Certified Nurse Midwife

## 2022-04-25 VITALS — BP 116/79 | HR 76 | Ht 66.0 in | Wt 223.7 lb

## 2022-04-25 DIAGNOSIS — Z30433 Encounter for removal and reinsertion of intrauterine contraceptive device: Secondary | ICD-10-CM | POA: Diagnosis not present

## 2022-04-25 DIAGNOSIS — Z30019 Encounter for initial prescription of contraceptives, unspecified: Secondary | ICD-10-CM

## 2022-04-25 MED ORDER — LEVONORGESTREL 20 MCG/DAY IU IUD
1.0000 | INTRAUTERINE_SYSTEM | Freq: Once | INTRAUTERINE | Status: AC
  Administered 2022-04-25: 1 via INTRAUTERINE

## 2022-04-25 NOTE — Progress Notes (Signed)
   GYNECOLOGY OFFICE PROCEDURE NOTE  Emily Simon is a 26 y.o. G0P0000 here for Mirena IUD removal and reinsertion. No GYN concerns.  Last pap smear was on 02/24/19 and was normal.  IUD Removal and Reinsertion  Patient identified, informed consent performed, consent signed.   Discussed risks of irregular bleeding, cramping, infection, malpositioning or misplacement of the IUD outside the uterus which may require further procedures. Also discussed >99% contraception efficacy, increased risk of ectopic pregnancy with failure of method.   Emphasized that this did not protect against STIs, condoms recommended during all sexual encounters.Advised to use backup contraception for one week as the risk of pregnancy is higher during the transition period of removing an IUD and replacing it with another one. Time out was performed. Speculum placed in the vagina. The strings of the IUD were grasped and pulled using ring forceps. The IUD was successfully removed in its entirety. The cervix was cleaned with Betadine x 2 and grasped anteriorly with a single tooth tenaculum.  The new MIRENA IUD insertion apparatus was used to sound the uterus to 7 cm;  the IUD was then placed per manufacturer's recommendations. Strings trimmed to 3 cm. Tenaculum was removed, good hemostasis noted. Patient tolerated procedure well.   Patient was given post-procedure instructions.  She was reminded to have backup contraception for one week during this transition period between IUDs.  Patient was also asked to check IUD strings periodically and follow up in 4 weeks for IUD check.  Doreene Burke, CNM

## 2022-04-25 NOTE — Patient Instructions (Signed)

## 2022-04-26 ENCOUNTER — Encounter: Payer: Self-pay | Admitting: Certified Nurse Midwife

## 2022-05-23 ENCOUNTER — Encounter: Payer: BC Managed Care – PPO | Admitting: Certified Nurse Midwife

## 2022-05-31 ENCOUNTER — Ambulatory Visit (INDEPENDENT_AMBULATORY_CARE_PROVIDER_SITE_OTHER): Payer: BC Managed Care – PPO | Admitting: Certified Nurse Midwife

## 2022-05-31 ENCOUNTER — Encounter: Payer: Self-pay | Admitting: Certified Nurse Midwife

## 2022-05-31 ENCOUNTER — Other Ambulatory Visit (HOSPITAL_COMMUNITY)
Admission: RE | Admit: 2022-05-31 | Discharge: 2022-05-31 | Disposition: A | Discharge status: Home / Self Care | Payer: BC Managed Care – PPO | Source: Ambulatory Visit | Attending: Certified Nurse Midwife | Admitting: Certified Nurse Midwife

## 2022-05-31 VITALS — BP 120/72 | HR 90 | Ht 66.0 in | Wt 223.3 lb

## 2022-05-31 DIAGNOSIS — Z124 Encounter for screening for malignant neoplasm of cervix: Secondary | ICD-10-CM | POA: Insufficient documentation

## 2022-05-31 DIAGNOSIS — Z01419 Encounter for gynecological examination (general) (routine) without abnormal findings: Secondary | ICD-10-CM | POA: Diagnosis not present

## 2022-05-31 DIAGNOSIS — Z30431 Encounter for routine checking of intrauterine contraceptive device: Secondary | ICD-10-CM

## 2022-05-31 NOTE — Patient Instructions (Signed)
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
## 2022-06-05 LAB — CYTOLOGY - PAP: Diagnosis: NEGATIVE

## 2022-06-06 ENCOUNTER — Ambulatory Visit (INDEPENDENT_AMBULATORY_CARE_PROVIDER_SITE_OTHER): Payer: BC Managed Care – PPO

## 2022-06-06 DIAGNOSIS — Z30431 Encounter for routine checking of intrauterine contraceptive device: Secondary | ICD-10-CM | POA: Diagnosis not present

## 2022-06-06 DIAGNOSIS — Z01419 Encounter for gynecological examination (general) (routine) without abnormal findings: Secondary | ICD-10-CM

## 2022-06-16 ENCOUNTER — Ambulatory Visit (INDEPENDENT_AMBULATORY_CARE_PROVIDER_SITE_OTHER): Payer: BC Managed Care – PPO | Admitting: Family

## 2022-06-16 ENCOUNTER — Encounter: Payer: Self-pay | Admitting: Family

## 2022-06-16 VITALS — BP 110/62 | HR 97 | Temp 98.6°F | Resp 16 | Ht 66.0 in | Wt 230.0 lb

## 2022-06-16 DIAGNOSIS — F330 Major depressive disorder, recurrent, mild: Secondary | ICD-10-CM | POA: Diagnosis not present

## 2022-06-16 DIAGNOSIS — L209 Atopic dermatitis, unspecified: Secondary | ICD-10-CM | POA: Diagnosis not present

## 2022-06-16 DIAGNOSIS — Z0001 Encounter for general adult medical examination with abnormal findings: Secondary | ICD-10-CM

## 2022-06-16 DIAGNOSIS — E210 Primary hyperparathyroidism: Secondary | ICD-10-CM | POA: Diagnosis not present

## 2022-06-16 LAB — COMPREHENSIVE METABOLIC PANEL
ALT: 21 U/L (ref 0–35)
AST: 63 U/L — ABNORMAL HIGH (ref 0–37)
Albumin: 4.4 g/dL (ref 3.5–5.2)
Alkaline Phosphatase: 65 U/L (ref 39–117)
BUN: 14 mg/dL (ref 6–23)
CO2: 30 mEq/L (ref 19–32)
Calcium: 9.7 mg/dL (ref 8.4–10.5)
Chloride: 103 mEq/L (ref 96–112)
Creatinine, Ser: 0.75 mg/dL (ref 0.40–1.20)
GFR: 109.95 mL/min (ref >60.00)
Glucose, Bld: 58 mg/dL — ABNORMAL LOW (ref 70–99)
Potassium: 3.9 mEq/L (ref 3.5–5.1)
Sodium: 141 mEq/L (ref 135–145)
Total Bilirubin: 0.9 mg/dL (ref 0.2–1.2)
Total Protein: 7 g/dL (ref 6.0–8.3)

## 2022-06-16 LAB — CBC WITH DIFFERENTIAL/PLATELET
Basophils Absolute: 0 10*3/uL (ref 0.0–0.1)
Basophils Relative: 0.6 % (ref 0.0–3.0)
Eosinophils Absolute: 0.2 10*3/uL (ref 0.0–0.7)
Eosinophils Relative: 2.5 % (ref 0.0–5.0)
HCT: 42.5 % (ref 36.0–46.0)
Hemoglobin: 14.2 g/dL (ref 12.0–15.0)
Lymphocytes Relative: 22.4 % (ref 12.0–46.0)
Lymphs Abs: 1.6 10*3/uL (ref 0.7–4.0)
MCHC: 33.5 g/dL (ref 30.0–36.0)
MCV: 101.2 fl — ABNORMAL HIGH (ref 78.0–100.0)
Monocytes Absolute: 0.7 10*3/uL (ref 0.1–1.0)
Monocytes Relative: 9.3 % (ref 3.0–12.0)
Neutro Abs: 4.7 10*3/uL (ref 1.4–7.7)
Neutrophils Relative %: 65.2 % (ref 43.0–77.0)
Platelets: 300 10*3/uL (ref 150.0–400.0)
RBC: 4.2 Mil/uL (ref 3.87–5.11)
RDW: 11.8 % (ref 11.5–15.5)
WBC: 7.3 10*3/uL (ref 4.0–10.5)

## 2022-06-16 LAB — VITAMIN D 25 HYDROXY (VIT D DEFICIENCY, FRACTURES): VITD: 35.32 ng/mL (ref 30.00–100.00)
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
## 2022-06-16 NOTE — Patient Instructions (Signed)
  Xyzal recommended in place of zyrtec.  Start flonase again.    Regards,   Eugenia Pancoast FNP-C

## 2022-06-17 LAB — PTH, INTACT AND CALCIUM
Calcium: 9.5 mg/dL (ref 8.6–10.2)
PTH: 31 pg/mL (ref 16–77)

## 2022-06-18 DIAGNOSIS — Z0001 Encounter for general adult medical examination with abnormal findings: Secondary | ICD-10-CM | POA: Insufficient documentation

## 2022-06-18 NOTE — Assessment & Plan Note (Signed)
Patient Counseling(The following topics were reviewed): ? Preventative care handout given to pt  ?Health maintenance and immunizations reviewed. Please refer to Health maintenance section. ?Pt advised on safe sex, wearing seatbelts in car, and proper nutrition ?labwork ordered today for annual ?Dental health: Discussed importance of regular tooth brushing, flossing, and dental visits. ? ? ?

## 2022-06-18 NOTE — Assessment & Plan Note (Signed)
Stable  Order pth intact and calcium Consider repeat thyroid u/s

## 2022-06-18 NOTE — Assessment & Plan Note (Signed)
stable °

## 2022-06-18 NOTE — Assessment & Plan Note (Signed)
Stable without medication and or therapy

## 2022-06-19 ENCOUNTER — Encounter: Payer: Self-pay | Admitting: Certified Nurse Midwife

## 2022-06-19 ENCOUNTER — Other Ambulatory Visit: Payer: Self-pay | Admitting: Family

## 2022-06-19 DIAGNOSIS — R7989 Other specified abnormal findings of blood chemistry: Secondary | ICD-10-CM

## 2022-06-19 NOTE — Progress Notes (Signed)
Incidental finding of low sugar, make sure you are eating every 2-3 hours to keep blood sugar level and that way you won't feel shaky or dizzy when you haven't eaten ina while.   Liver function mildly elevated, let's repeat x two weeks.  Any recent etoh, tylenol, nsaids and or herbal supplements?   Pth normal.  Vitamin d acceptable.   Have pt make lab only two week appt

## 2022-07-04 ENCOUNTER — Other Ambulatory Visit: Payer: BC Managed Care – PPO

## 2022-07-04 ENCOUNTER — Other Ambulatory Visit (INDEPENDENT_AMBULATORY_CARE_PROVIDER_SITE_OTHER): Payer: BC Managed Care – PPO

## 2022-07-04 DIAGNOSIS — R7989 Other specified abnormal findings of blood chemistry: Secondary | ICD-10-CM | POA: Diagnosis not present

## 2022-07-04 LAB — HEPATIC FUNCTION PANEL
ALT: 9 U/L (ref 0–35)
AST: 15 U/L (ref 0–37)
Albumin: 4 g/dL (ref 3.5–5.2)
Alkaline Phosphatase: 72 U/L (ref 39–117)
Bilirubin, Direct: 0.2 mg/dL (ref 0.0–0.3)
Total Bilirubin: 0.9 mg/dL (ref 0.2–1.2)
Total Protein: 6.4 g/dL (ref 6.0–8.3)

## 2022-07-21 ENCOUNTER — Ambulatory Visit (INDEPENDENT_AMBULATORY_CARE_PROVIDER_SITE_OTHER): Payer: BC Managed Care – PPO | Admitting: Podiatry

## 2022-07-21 DIAGNOSIS — B070 Plantar wart: Secondary | ICD-10-CM | POA: Diagnosis not present

## 2022-07-21 NOTE — Progress Notes (Signed)
   Chief Complaint  Patient presents with   Plantar Warts    Patient is here for left foot plantar wart, she states that she has had this issues for years.    Subjective: 26 y.o. female presenting today as a new patient for evaluation of a plantar wart to the left foot.  Patient states that she does have a history of plantar verruca to the right foot which was excisionally cut out when she was a child.  She has been dealing with this left wart for few years now.  She has not done anything for treatment other than shaving it down routinely..  She presents for further treatment and evaluation   Past Medical History:  Diagnosis Date   Depression    Hyperparathyroidism, primary (HCC) 03/15/2009   Insomnia 10/27/2009   Metrorrhagia 02/02/2009   Migraine headache without aura    Palpitations     Objective: Physical Exam General: The patient is alert and oriented x3 in no acute distress.   Dermatology: Hyperkeratotic skin lesion(s) noted to the plantar aspect of the left foot approximately 1 cm in diameter. Pinpoint bleeding noted upon debridement. Skin is warm, dry and supple bilateral lower extremities. Negative for open lesions or macerations.   Vascular: Palpable pedal pulses bilaterally. No edema or erythema noted. Capillary refill within normal limits.   Neurological: Epicritic and protective threshold grossly intact bilaterally.    Musculoskeletal Exam: Pain on palpation to the noted skin lesion(s).  Range of motion within normal limits to all pedal and ankle joints bilateral. Muscle strength 5/5 in all groups bilateral.    Assessment: #1 plantar wart left foot   Plan of Care:  #1 Patient was evaluated. #2 Excisional debridement of the plantar wart lesion(s) was performed using a chisel blade. Cantharone was applied and the lesion(s) was dressed with a dry sterile dressing. #3 patient is to return to clinic in 3 weeks  *Works at [employer]  Felecia Shelling,  DPM [facility]  Dr. Felecia Shelling, DPM    [address]                                        [place], [place] [address]                Office [phone]  Fax [phone]

## 2022-08-11 ENCOUNTER — Ambulatory Visit (INDEPENDENT_AMBULATORY_CARE_PROVIDER_SITE_OTHER): Payer: BC Managed Care – PPO | Admitting: Podiatry

## 2022-08-11 DIAGNOSIS — B070 Plantar wart: Secondary | ICD-10-CM

## 2022-08-14 NOTE — Progress Notes (Signed)
   Chief Complaint  Patient presents with   Plantar Warts    Patient is here for follow-up for left foot plantar wart.    Subjective: 26 y.o. female presenting today for follow-up evaluation of a plantar verruca to the left foot.  Patient doing well.  She has noticed improvement to the area.  She presents for further treatment and evaluation   Past Medical History:  Diagnosis Date   Depression    Hyperparathyroidism, primary (HCC) 03/15/2009   Insomnia 10/27/2009   Metrorrhagia 02/02/2009   Migraine headache without aura    Palpitations     Objective: Physical Exam General: The patient is alert and oriented x3 in no acute distress.   Dermatology: Verruca lesion appears to be resolved.  There is only healthy pink viable skin to the area.  No clinical indication of remaining verruca tissue   vascular: Palpable pedal pulses bilaterally. No edema or erythema noted. Capillary refill within normal limits.   Neurological: Epicritic and protective threshold grossly intact bilaterally.    Musculoskeletal Exam: Negative for any significant tenderness with palpation.  No pedal deformity.  Assessment: #1 plantar wart left foot   Plan of Care:  #1 Patient was evaluated. #2  Overall the verruca lesion appears to be resolved completely.  Patient may resume full activity no restrictions #3 return to clinic as needed  *Works at [employer]  Felecia Shelling, DPM [facility]  Dr. Felecia Shelling, DPM    [address]                                        [place], [place] [address]                Office [phone]  Fax [phone]

## 2022-09-12 ENCOUNTER — Encounter: Payer: Self-pay | Admitting: Family

## 2022-09-12 ENCOUNTER — Telehealth (INDEPENDENT_AMBULATORY_CARE_PROVIDER_SITE_OTHER): Payer: BC Managed Care – PPO | Admitting: Family

## 2022-09-12 VITALS — Ht 66.0 in | Wt 230.0 lb

## 2022-09-12 DIAGNOSIS — J029 Acute pharyngitis, unspecified: Secondary | ICD-10-CM | POA: Diagnosis not present

## 2022-09-12 MED ORDER — CEPHALEXIN 500 MG PO CAPS: 500.0000 mg | ORAL_CAPSULE | Freq: Two times a day (BID) | ORAL | 0 refills | Status: AC

## 2022-09-12 NOTE — Assessment & Plan Note (Signed)
Suspected strep.  Rx cephalexin 500 mg bid x 10 days  Ibuprofen/tyelnol prn sore throat/fever Pt told to F/u if no improvement in the next 2-3 days.

## 2022-09-12 NOTE — Progress Notes (Signed)
MyChart Video Visit    Virtual Visit via Video Note   This visit type was conducted due to national recommendations for restrictions regarding the COVID-19 Pandemic (e.g. social distancing) in an effort to limit this patient's exposure and mitigate transmission in our community. This patient is at least at moderate risk for complications without adequate follow up. This format is felt to be most appropriate for this patient at this time. Physical exam was limited by quality of the video and audio technology used for the visit. CMA was able to get the patient set up on a video visit.  Patient location: Home. Patient and provider in visit Provider location: Office  I discussed the limitations of evaluation and management by telemedicine and the availability of in person appointments. The patient expressed understanding and agreed to proceed.  Visit Date: 09/12/2022  Today's healthcare provider: Mort Sawyers, FNP     Subjective:    Patient ID: Emily Simon, female    DOB: 1995-12-31, 27 y.o.   MRN: [id number]  Chief Complaint  Patient presents with   Sore Throat    History of strep    HPI  Pt here today via video visit with concerns.   About three days ago started with a scratching sore throat. Has since progressed in pain. Continued all day yesterday with pain and woke her up at 2 am and was hard for her to swallow and has been bothering her since. No sob. Able to swallow liquid and food. Slight tenderness mid lower area of neck upon her own palpation.   No nasal congestion. No ear pain.  No fever or pills.  slight right ear fullness.  No fever or chills.  No cough or chest congestion.   Past Medical History:  Diagnosis Date   Depression    Hyperparathyroidism, primary (HCC) 03/15/2009   Insomnia 10/27/2009   Metrorrhagia 02/02/2009   Migraine headache without aura    Palpitations     Past Surgical History:  Procedure Laterality Date   Left arm surgery Left  2010   Had 5 pins and plate in left arm and this was done twice   tubes bilaterally as a small child      Family History  Problem Relation Age of Onset   Migraines Mother        chronic headaches/migraines   Asthma Mother    Diabetes Mother    Hypertension Mother    Hyperlipidemia Mother    Sleep apnea Mother    Depression Mother    Obesity Mother    Healthy Father    Uterine cancer Maternal Grandmother    Kidney cancer Maternal Grandmother    Heart failure Maternal Grandmother    Breast cancer Maternal Grandmother        unknown age   Ovarian cancer Neg Hx     Social History   Socioeconomic History   Marital status: Significant Other    Spouse name: Not on file   Number of children: 0   Years of education: Not on file   Highest education level: Not on file  Occupational History   Occupation: [employer]ologist, 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  Other Topics Concern   Not on file  Social History Narrative   In a relationship for five years    Social Determinants of Health   Financial Resource Strain: Not on file  Food Insecurity: Not on file  Transportation Needs: Not on file  Physical Activity: Not on file  Stress: Not on file  Social Connections: Not on file  Intimate Partner Violence: Not on file    Outpatient Medications Prior to Visit  Medication Sig Dispense Refill   fluticasone (FLONASE) 50 MCG/ACT nasal spray Place 2 sprays into both nostrils daily. 16 g 6   levocetirizine (XYZAL) 5 MG tablet Take 5 mg by mouth every evening.     levonorgestrel (MIRENA) 20 MCG/24HR IUD 1 each by Intrauterine route once.     Oral Electrolytes (EMERGEN-C ELECTRO MIX) PACK Take by mouth.      No facility-administered medications prior to visit.    Allergies  Allergen Reactions   Imitrex  [Sumatriptan] Other (See Comments)    Other Reaction: worsened symptoms acute worsening of migraine, jaw pain   Codeine Hives and Itching   Amoxicillin-Pot Clavulanate Nausea And Vomiting and Other (See Comments)    weakness   Metronidazole Nausea Only   Red Dye Nausea And Vomiting    Review of Systems     Objective:    Physical Exam Constitutional:      General: She is not in acute distress.    Appearance: Normal appearance. She is not ill-appearing or toxic-appearing.  Pulmonary:     Effort: Pulmonary effort is normal.  Neurological:     General: No focal deficit present.     Mental Status: She is alert and oriented to person, place, and time. Mental status is at baseline.  Psychiatric:        Mood and Affect: Mood normal.        Behavior: Behavior normal.        Thought Content: Thought content normal.        Judgment: Judgment normal.     Ht 5\' 6"  (1.676 m)   Wt 230 lb (104.3 kg)   BMI 37.12 kg/m  Wt Readings from Last 3 Encounters:  09/12/22 230 lb (104.3 kg)  06/16/22 230 lb (104.3 kg)  05/31/22 223 lb 4.8 oz (101.3 kg)       Assessment & Plan:   Problem List Items Addressed This Visit       Respiratory   Pharyngitis - Primary    Suspected strep.  Rx cephalexin 500 mg bid x 10 days  Ibuprofen/tyelnol prn sore throat/fever Pt told to F/u if no improvement in the next 2-3 days.       Relevant Medications   cephALEXin (KEFLEX) 500 MG capsule    I am having 06/02/22 start on cephALEXin. I am also having her maintain her levonorgestrel, Emergen-C Electro Mix, fluticasone, and levocetirizine.  Meds ordered this encounter  Medications   cephALEXin (KEFLEX) 500 MG capsule    Sig: Take 1 capsule (500 mg total) by mouth 2 (two) times daily for 10 days.    Dispense:  20 capsule    Refill:  0    Pt tolerates medication    Order Specific  Question:   Supervising Provider    Answer:   BEDSOLE, AMY E [[id number]]    I discussed the assessment and treatment plan with the patient. The patient was provided an opportunity to ask questions and all  were answered. The patient agreed with the plan and demonstrated an understanding of the instructions.   The patient was advised to call back or seek an in-person evaluation if the symptoms worsen or if the condition fails to improve as anticipated.  I provided 15 minutes of face-to-face time during this encounter.   [place]enia Pancoast, [place] at [place] [phone] (phone) [phone] (fax)  [place]

## 2022-12-12 ENCOUNTER — Ambulatory Visit (INDEPENDENT_AMBULATORY_CARE_PROVIDER_SITE_OTHER): Payer: BC Managed Care – PPO | Admitting: Family

## 2022-12-12 ENCOUNTER — Encounter: Payer: Self-pay | Admitting: Family

## 2022-12-12 VITALS — BP 120/88 | HR 96 | Temp 97.6°F | Ht 66.0 in | Wt 239.2 lb

## 2022-12-12 DIAGNOSIS — J039 Acute tonsillitis, unspecified: Secondary | ICD-10-CM | POA: Insufficient documentation

## 2022-12-12 DIAGNOSIS — J029 Acute pharyngitis, unspecified: Secondary | ICD-10-CM

## 2022-12-12 LAB — POCT RAPID STREP A (OFFICE): Rapid Strep A Screen: NEGATIVE

## 2022-12-12 MED ORDER — CEPHALEXIN 500 MG PO CAPS
500.0000 mg | ORAL_CAPSULE | Freq: Two times a day (BID) | ORAL | 0 refills | Status: AC
Start: 2022-12-12 — End: 2022-12-22

## 2022-12-12 NOTE — Progress Notes (Signed)
Established Patient Office Visit  Subjective:   Patient ID: Emily Simon, female    DOB: 12-30-1995  Age: 27 y.o. MRN: [id number]  CC:  Chief Complaint  Patient presents with   Sore Throat   Cough    HPI: Emily Simon is a 27 y.o. female presenting on 12/12/2022 for Sore Throat and Cough    Sore Throat  Associated symptoms include coughing.  Cough    8 days ago with sore and scratchy throat. She does state she thinks she had a fever about four days ago, woke up in the middle of the night sweating. Not since. Some nasal congestion, no sinus pressure. Coughing dry cough and no chest congestion.   Not taking any otc medication Just taking xyzal and vitamin C   Not really noticing an improvement in symptoms and hurts to swallow.            ROS: Negative unless specifically indicated above in HPI.   Relevant past medical history reviewed and updated as indicated.   Allergies and medications reviewed and updated.   Current Outpatient Medications:    cephALEXin (KEFLEX) 500 MG capsule, Take 1 capsule (500 mg total) by mouth 2 (two) times daily for 10 days., Disp: 20 capsule, Rfl: 0   fluticasone (FLONASE) 50 MCG/ACT nasal spray, Place 2 sprays into both nostrils daily., Disp: 16 g, Rfl: 6   levocetirizine (XYZAL) 5 MG tablet, Take 5 mg by mouth every evening., Disp: , Rfl:    levonorgestrel (MIRENA) 20 MCG/24HR IUD, 1 each by Intrauterine route once., Disp: , Rfl:    Oral Electrolytes (EMERGEN-C ELECTRO MIX) PACK, Take by mouth., Disp: , Rfl:   Allergies  Allergen Reactions   Imitrex  [Sumatriptan] Other (See Comments)    Other Reaction: worsened symptoms acute worsening of migraine, jaw pain   Codeine Hives and Itching   Amoxicillin-Pot Clavulanate Nausea And Vomiting and Other (See Comments)    weakness   Metronidazole Nausea Only   Red Dye Nausea And Vomiting    Objective:   BP 120/88 (BP Location: Right Arm)   Pulse 96   Temp 97.6 F (36.4 C)  (Temporal)   Ht 5\' 6"  (1.676 m)   Wt 239 lb 3.2 oz (108.5 kg)   SpO2 97%   BMI 38.61 kg/m    Physical Exam Constitutional:      General: She is not in acute distress.    Appearance: Normal appearance. She is normal weight. She is not ill-appearing, toxic-appearing or diaphoretic.  HENT:     Head: Normocephalic.     Right Ear: Tympanic membrane normal.     Left Ear: Tympanic membrane normal.     Nose: Nose normal.     Mouth/Throat:     Mouth: Mucous membranes are dry.     Pharynx: Posterior oropharyngeal erythema present. No oropharyngeal exudate.  Eyes:     Extraocular Movements: Extraocular movements intact.     Pupils: Pupils are equal, round, and reactive to light.  Cardiovascular:     Rate and Rhythm: Normal rate and regular rhythm.     Pulses: Normal pulses.     Heart sounds: Normal heart sounds.  Pulmonary:     Effort: Pulmonary effort is normal.     Breath sounds: Normal breath sounds.  Musculoskeletal:     Cervical back: Normal range of motion.  Lymphadenopathy:     Cervical: Cervical adenopathy present.     Right cervical: Superficial cervical adenopathy present.  Left cervical: Superficial cervical adenopathy present.  Neurological:     General: No focal deficit present.     Mental Status: She is alert and oriented to person, place, and time. Mental status is at baseline.  Psychiatric:        Mood and Affect: Mood normal.        Behavior: Behavior normal.        Thought Content: Thought content normal.        Judgment: Judgment normal.     Assessment & Plan:  Sore throat -     POCT rapid strep A -     Cephalexin; Take 1 capsule (500 mg total) by mouth 2 (two) times daily for 10 days.  Dispense: 20 capsule; Refill: 0  Acute tonsillitis, unspecified etiology Assessment & Plan: Strep in office, borderline positive however hard to decipher Will treat as over 7 days not improving.  Continue xyzal Rx cephalexin 500 mg po bid x 10 days Ibuprofen/tyelnol  prn sore throat/fever Pt told to F/u if no improvement in the next 2-3 days.   Orders: -     Cephalexin; Take 1 capsule (500 mg total) by mouth 2 (two) times daily for 10 days.  Dispense: 20 capsule; Refill: 0     Follow up plan: Return if symptoms worsen or fail to improve.  Mort Sawyers, FNP

## 2022-12-12 NOTE — Assessment & Plan Note (Signed)
Strep in office, borderline positive however hard to decipher Will treat as over 7 days not improving.  Continue xyzal Rx cephalexin 500 mg po bid x 10 days Ibuprofen/tyelnol prn sore throat/fever Pt told to F/u if no improvement in the next 2-3 days.

## 2022-12-12 NOTE — Patient Instructions (Signed)
------------------------------------    You were found to be strep positive,  Take antibiotics that have been sent to the pharmacy.  Change your toothbrush after 24 hours on the antibiotics.  Gargle with warm salt water as needed for sore throat.   ------------------------------------   Regards,   Anuradha Chabot FNP-C  

## 2023-02-20 NOTE — Progress Notes (Unsigned)
    Derron Pipkins T. Gery Sabedra, MD, CAQ Sports Medicine [facility] [address] [place], [address]  Phone: [phone]  FAX: [phone]  Yazleemar Brodt - 27 y.o. female  MRN [id number]  Date of Birth: 1995/09/13  Date: 02/21/2023  PCP: Mort Sawyers, FNP  Referral: Mort Sawyers, FNP  No chief complaint on file.  Subjective:   Mane Weitman is a 27 y.o. very pleasant female patient with There is no height or weight on file to calculate BMI. who presents with the following:  This pleasant young lady presents with some ongoing knee pain.    Review of Systems is noted in the HPI, as appropriate  Objective:   There were no vitals taken for this visit.  GEN: No acute distress; alert,appropriate. PULM: Breathing comfortably in no respiratory distress PSYCH: Normally interactive.   Laboratory and Imaging Data:  Assessment and Plan:   ***

## 2023-02-21 ENCOUNTER — Ambulatory Visit (INDEPENDENT_AMBULATORY_CARE_PROVIDER_SITE_OTHER): Payer: BC Managed Care – PPO | Admitting: Family Medicine

## 2023-02-21 ENCOUNTER — Encounter: Payer: Self-pay | Admitting: Family Medicine

## 2023-02-21 VITALS — BP 100/70 | HR 84 | Temp 98.1°F | Ht 66.0 in | Wt 243.1 lb

## 2023-02-21 DIAGNOSIS — M7050 Other bursitis of knee, unspecified knee: Secondary | ICD-10-CM | POA: Diagnosis not present

## 2023-02-21 DIAGNOSIS — M25562 Pain in left knee: Secondary | ICD-10-CM | POA: Diagnosis not present

## 2023-02-21 MED ORDER — DICLOFENAC SODIUM 75 MG PO TBEC: 75.0000 mg | DELAYED_RELEASE_TABLET | Freq: Two times a day (BID) | ORAL | 2 refills | Status: AC

## 2023-02-21 NOTE — Patient Instructions (Signed)
Voltaren 1% gel, over the counter ?You can apply up to 4 times a day ? ?This can be applied to any joint: knee, wrist, fingers, elbows, shoulders, feet and ankles. ?Can apply to any tendon: tennis elbow, achilles, tendon, rotator cuff or any other tendon. ? ?Minimal is absorbed in the bloodstream: ok with oral anti-inflammatory or a blood thinner. ? ?Cost is about 9 dollars  ?

## 2023-03-23 ENCOUNTER — Encounter: Payer: Self-pay | Admitting: Family Medicine

## 2023-03-23 ENCOUNTER — Ambulatory Visit (INDEPENDENT_AMBULATORY_CARE_PROVIDER_SITE_OTHER): Payer: PRIVATE HEALTH INSURANCE | Admitting: Family Medicine

## 2023-03-23 VITALS — BP 106/80 | HR 88 | Temp 98.5°F | Ht 66.0 in | Wt 250.0 lb

## 2023-03-23 DIAGNOSIS — J0140 Acute pansinusitis, unspecified: Secondary | ICD-10-CM | POA: Diagnosis not present

## 2023-03-23 DIAGNOSIS — J0100 Acute maxillary sinusitis, unspecified: Secondary | ICD-10-CM | POA: Diagnosis not present

## 2023-03-23 MED ORDER — AZITHROMYCIN 250 MG PO TABS
ORAL_TABLET | ORAL | 0 refills | Status: AC
Start: 2023-03-23 — End: 2023-03-28

## 2023-03-23 NOTE — Patient Instructions (Signed)
Rest and fluids.  Start zithromax.  Udpate [place] as needed.

## 2023-03-23 NOTE — Progress Notes (Unsigned)
duration of symptoms: about 1 week. Initially with ST.  Gargled with salt water.  Noted tonsil irritation.   rhinorrhea: minimally in the AM congestion: yes, in the AMs ear pain: yes, L>R sore throat: yes cough: yes, AM.  myalgias: no  Fevers: no  Some medial maxillary sinus pain in the AM.    Working at [employer].    Per HPI unless specifically indicated in ROS section   Meds, vitals, and allergies reviewed.   GEN: nad, alert and oriented HEENT: mucous membranes moist, TM w/o erythema, nasal epithelium injected, OP with cobblestoning NECK: supple w/o LA CV: rrr. PULM: ctab, no inc wob EXT: no edema  Maxillary sinuses ttp B

## 2023-03-25 DIAGNOSIS — J0100 Acute maxillary sinusitis, unspecified: Secondary | ICD-10-CM | POA: Insufficient documentation

## 2023-03-25 NOTE — Assessment & Plan Note (Signed)
Rest and fluids.  Start zithromax.  Udpate [place] as needed.   Okay for outpatient follow-up.

## 2023-04-03 ENCOUNTER — Encounter: Payer: Self-pay | Admitting: Internal Medicine

## 2023-04-03 ENCOUNTER — Telehealth (INDEPENDENT_AMBULATORY_CARE_PROVIDER_SITE_OTHER): Payer: PRIVATE HEALTH INSURANCE | Admitting: Internal Medicine

## 2023-04-03 VITALS — Ht 66.0 in | Wt 250.0 lb

## 2023-04-03 DIAGNOSIS — Z975 Presence of (intrauterine) contraceptive device: Secondary | ICD-10-CM | POA: Diagnosis not present

## 2023-04-03 DIAGNOSIS — J0100 Acute maxillary sinusitis, unspecified: Secondary | ICD-10-CM

## 2023-04-03 MED ORDER — BENZONATATE 200 MG PO CAPS: 200.0000 mg | ORAL_CAPSULE | Freq: Three times a day (TID) | ORAL | 1 refills | Status: AC | PRN

## 2023-04-03 MED ORDER — ONDANSETRON HCL 4 MG PO TABS: 4.0000 mg | ORAL_TABLET | Freq: Three times a day (TID) | ORAL | 0 refills | Status: AC | PRN

## 2023-04-03 MED ORDER — PREDNISONE 10 MG PO TABS: ORAL_TABLET | ORAL | 0 refills | Status: AC

## 2023-04-03 MED ORDER — AMOXICILLIN-POT CLAVULANATE 875-125 MG PO TABS: 1.0000 | ORAL_TABLET | Freq: Two times a day (BID) | ORAL | 0 refills | Status: AC

## 2023-04-03 NOTE — Assessment & Plan Note (Addendum)
2nd episode in one month  patient wonders if she has an environmental allergen.  Takes Xyzal once daily but has increased symptoms when at work. Advised to discuss referral for allergy testing with PCP after illness has been resolved.  Repeating treatment with broader spectrum abx.  Augmentin ,  prednisone taper,  tessalon perles for cough.  Probiotic use for 3 weeks advised .  Encouraged to test for COVID .    Has Mirena IUD

## 2023-04-03 NOTE — Progress Notes (Signed)
Virtual Visit via Caregility   Note   This format is felt to be most appropriate for this patient at this time.  All issues noted in this document were discussed and addressed.  No physical exam was performed (except for noted visual exam findings with Video Visits).   I connected with Emily Simon on 04/03/23 at  4:00 PM EDT by a video enabled telemedicine application or telephone and verified that I am speaking with the correct person using two identifiers. Location patient: home Location provider: work or home office Persons participating in the virtual visit: patient, provider  I discussed the limitations, risks, security and privacy concerns of performing an evaluation and management service by telephone and the availability of in person appointments. I also discussed with the patient that there may be a patient responsible charge related to this service. The patient expressed understanding and agreed to proceed.   Reason for visit: URI Symptoms   HPI:   27 yr old female treated July 19 for maxillary sinusitis after one week of persistent URI symptoms   presents with 4 day history of sore throat, bilateral sinus congestion  and ear fullness,  persistent cough and post tussive emesis.  Symptoms improved  for several days after treatment for sinusitis .  Current illness may have included a fever (she had shaking chills last night while taking a hot shower). Has not taken a COVID test. Masking at work and no other employee contact this week   Symptosm of body aches, ST congestion post tussive emesis started on Sunday July 28   ROS: See pertinent positives and negatives per HPI.  Past Medical History:  Diagnosis Date   Depression    Hyperparathyroidism, primary (HCC) 03/15/2009   Insomnia 10/27/2009   Metrorrhagia 02/02/2009   Migraine headache without aura    Palpitations     Past Surgical History:  Procedure Laterality Date   Left arm surgery Left 2010   Had 5 pins and plate in  left arm and this was done twice   tubes bilaterally as a small child      Family History  Problem Relation Age of Onset   Migraines Mother        chronic headaches/migraines   Asthma Mother    Diabetes Mother    Hypertension Mother    Hyperlipidemia Mother    Sleep apnea Mother    Depression Mother    Obesity Mother    Healthy Father    Uterine cancer Maternal Grandmother    Kidney cancer Maternal Grandmother    Heart failure Maternal Grandmother    Breast cancer Maternal Grandmother        unknown age   Ovarian cancer Neg Hx     SOCIAL HX:  reports that she has never smoked. She has never used smokeless tobacco. She reports current alcohol use. She reports that she does not use drugs.    Current Outpatient Medications:    amoxicillin-clavulanate (AUGMENTIN) 875-125 MG tablet, Take 1 tablet by mouth 2 (two) times daily., Disp: 14 tablet, Rfl: 0   benzonatate (TESSALON) 200 MG capsule, Take 1 capsule (200 mg total) by mouth 3 (three) times daily as needed for cough., Disp: 60 capsule, Rfl: 1   diclofenac (VOLTAREN) 75 MG EC tablet, Take 1 tablet (75 mg total) by mouth 2 (two) times daily., Disp: 60 tablet, Rfl: 2   fluticasone (FLONASE) 50 MCG/ACT nasal spray, Place 2 sprays into both nostrils daily., Disp: 16 g, Rfl: 6   levocetirizine (  XYZAL) 5 MG tablet, Take 5 mg by mouth every evening., Disp: , Rfl:    levonorgestrel (MIRENA) 20 MCG/24HR IUD, 1 each by Intrauterine route once., Disp: , Rfl:    ondansetron (ZOFRAN) 4 MG tablet, Take 1 tablet (4 mg total) by mouth every 8 (eight) hours as needed for nausea or vomiting., Disp: 20 tablet, Rfl: 0   Oral Electrolytes (EMERGEN-C ELECTRO MIX) PACK, Take by mouth., Disp: , Rfl:    predniSONE (DELTASONE) 10 MG tablet, 6 tablets on Day 1 , then reduce by 1 tablet daily until gone, Disp: 21 tablet, Rfl: 0  EXAM:  VITALS per patient if applicable:  GENERAL: alert, oriented, appears well and in no acute distress  HEENT: atraumatic,  conjunttiva clear, no obvious abnormalities on inspection of external nose and ears  NECK: normal movements of the head and neck  LUNGS: on inspection no signs of respiratory distress, breathing rate appears normal, no obvious gross SOB, gasping or wheezing  CV: no obvious cyanosis  MS: moves all visible extremities without noticeable abnormality  PSYCH/NEURO: pleasant and cooperative, no obvious depression or anxiety, speech and thought processing grossly intact  ASSESSMENT AND PLAN: Acute non-recurrent maxillary sinusitis Assessment & Plan: 2nd episode in one month  patient wonders if she has an environmental allergen.  Takes Xyzal once daily but has increased symptoms when at work. Advised to discuss referral for allergy testing with PCP after illness has been resolved.  Repeating treatment with broader spectrum abx.  Augmentin ,  prednisone taper,  tessalon perles for cough.  Probiotic use for 3 weeks advised .  Encouraged to test for COVID .    Has Mirena IUD   IUD (intrauterine device) in place  Other orders -     Amoxicillin-Pot Clavulanate; Take 1 tablet by mouth 2 (two) times daily.  Dispense: 14 tablet; Refill: 0 -     predniSONE; 6 tablets on Day 1 , then reduce by 1 tablet daily until gone  Dispense: 21 tablet; Refill: 0 -     Benzonatate; Take 1 capsule (200 mg total) by mouth 3 (three) times daily as needed for cough.  Dispense: 60 capsule; Refill: 1 -     Ondansetron HCl; Take 1 tablet (4 mg total) by mouth every 8 (eight) hours as needed for nausea or vomiting.  Dispense: 20 tablet; Refill: 0      I discussed the assessment and treatment plan with the patient. The patient was provided an opportunity to ask questions and all were answered. The patient agreed with the plan and demonstrated an understanding of the instructions.   The patient was advised to call back or seek an in-person evaluation if the symptoms worsen or if the condition fails to improve as  anticipated.   I spent 20 minutes dedicated to the care of this patient on the date of this encounter to include pre-visit review of her medical history,  Face-to-face time with the patient , and post visit ordering of testing and therapeutics.    Sherlene Shams, MD

## 2023-07-20 ENCOUNTER — Ambulatory Visit (INDEPENDENT_AMBULATORY_CARE_PROVIDER_SITE_OTHER): Payer: PRIVATE HEALTH INSURANCE | Admitting: Podiatry

## 2023-07-20 ENCOUNTER — Encounter: Payer: Self-pay | Admitting: Podiatry

## 2023-07-20 DIAGNOSIS — B070 Plantar wart: Secondary | ICD-10-CM | POA: Diagnosis not present

## 2023-08-06 NOTE — Progress Notes (Signed)
   Chief Complaint  Patient presents with   Plantar Warts    "I have a Plantars Wart on my left foot.  I saw him last year and he put some stuff on it but now it's growing back."    Subjective: 27 y.o. female presenting today for evaluation of recurrence of a plantar wart to the left foot.  Patient was last seen in the office approximately 1 year ago.  It was treated with Cantharone and the lesion resolved.  Unfortunately it has returned and she presents today for further treatment evaluation   Past Medical History:  Diagnosis Date   Depression    Hyperparathyroidism, primary (HCC) 03/15/2009   Insomnia 10/27/2009   Metrorrhagia 02/02/2009   Migraine headache without aura    Palpitations     Objective: Physical Exam General: The patient is alert and oriented x3 in no acute distress.   Dermatology: Hyperkeratotic skin lesion(s) noted to the plantar aspect of the left foot approximately 1 cm in diameter. Pinpoint bleeding noted upon debridement. Skin is warm, dry and supple bilateral lower extremities. Negative for open lesions or macerations.   Vascular: Palpable pedal pulses bilaterally. No edema or erythema noted. Capillary refill within normal limits.   Neurological: Grossly intact via light touch   Musculoskeletal Exam: Pain on palpation to the noted skin lesion(s).  Range of motion within normal limits to all pedal and ankle joints bilateral. Muscle strength 5/5 in all groups bilateral.    Assessment: #1 plantar wart left foot   Plan of Care:  -Patient was evaluated. -Excisional debridement of the plantar wart lesion(s) was performed using a chisel blade. Cantharone was applied and the lesion(s) was dressed with a dry sterile dressing. -Patient is to return to clinic in 2 weeks  Felecia Shelling, DPM [facility]  Dr. Felecia Shelling, DPM    [address]                                        [place], [place] [address]                Office [phone]  Fax [phone]

## 2023-08-10 ENCOUNTER — Ambulatory Visit: Payer: PRIVATE HEALTH INSURANCE | Admitting: Podiatry
